# Patient Record
Sex: Male | Born: 1940 | Race: White | Hispanic: No | Marital: Married | Smoking: Former smoker
Health system: Southern US, Community
[De-identification: ages and names within clinical notes are randomized; demographics above are authoritative.]

## PROBLEM LIST (undated history)

## (undated) DIAGNOSIS — E119 Type 2 diabetes mellitus without complications: Secondary | ICD-10-CM

## (undated) DIAGNOSIS — E785 Hyperlipidemia, unspecified: Secondary | ICD-10-CM

## (undated) DIAGNOSIS — L409 Psoriasis, unspecified: Secondary | ICD-10-CM

## (undated) DIAGNOSIS — J449 Chronic obstructive pulmonary disease, unspecified: Secondary | ICD-10-CM

## (undated) DIAGNOSIS — F0390 Unspecified dementia without behavioral disturbance: Secondary | ICD-10-CM

---

## 2007-05-26 ENCOUNTER — Other Ambulatory Visit: Payer: Self-pay

## 2007-05-26 ENCOUNTER — Emergency Department: Payer: Self-pay | Admitting: Internal Medicine

## 2007-06-22 ENCOUNTER — Other Ambulatory Visit: Payer: Self-pay

## 2007-06-22 ENCOUNTER — Emergency Department: Payer: Self-pay | Admitting: Emergency Medicine

## 2007-07-11 ENCOUNTER — Other Ambulatory Visit: Payer: Self-pay

## 2007-07-11 ENCOUNTER — Emergency Department: Payer: Self-pay | Admitting: Emergency Medicine

## 2007-08-06 ENCOUNTER — Ambulatory Visit: Payer: Self-pay | Admitting: Internal Medicine

## 2009-07-17 IMAGING — CR DG CHEST 1V PORT
1 series · 1 of 1 positions shown · non-contrast
Comparison: none

REASON FOR EXAM: sob
COMMENTS:

PROCEDURE:     DXR - DXR PORTABLE CHEST SINGLE VIEW  - May 26, 2007  [DATE]
RESULT:     No acute cardiopulmonary disease is identified.

[view not recorded]
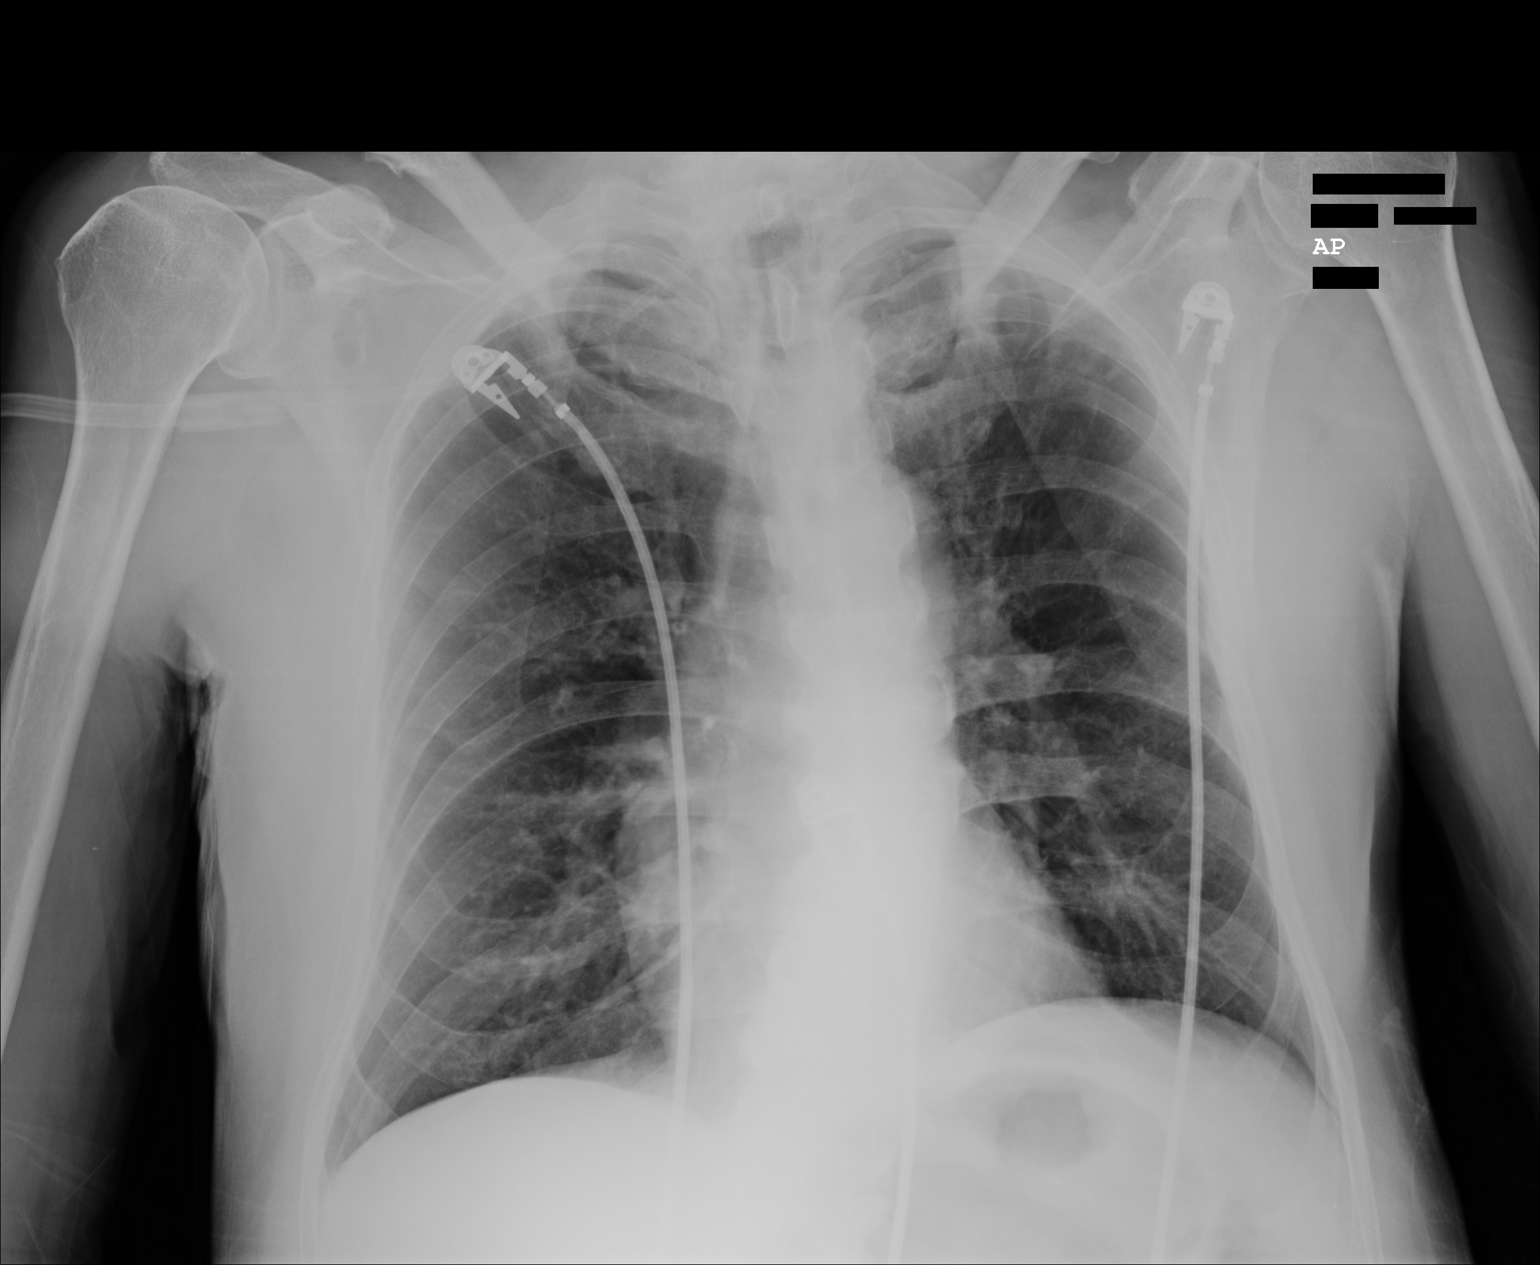

[1 of 1 positions shown; findings below may reference images not displayed]

IMPRESSION: No acute cardiopulmonary disease.

## 2010-09-07 ENCOUNTER — Emergency Department: Payer: Self-pay | Admitting: Emergency Medicine

## 2014-02-18 ENCOUNTER — Emergency Department: Payer: Self-pay | Admitting: Emergency Medicine

## 2014-02-18 LAB — CBC
HCT: 43.2 % (ref 40.0–52.0)
HGB: 14.3 g/dL (ref 13.0–18.0)
MCH: 27.7 pg (ref 26.0–34.0)
MCHC: 33.1 g/dL (ref 32.0–36.0)
MCV: 84 fL (ref 80–100)
Platelet: 200 10*3/uL (ref 150–440)
RBC: 5.15 10*6/uL (ref 4.40–5.90)
RDW: 14.5 % (ref 11.5–14.5)
WBC: 6.1 10*3/uL (ref 3.8–10.6)

## 2014-02-18 LAB — BASIC METABOLIC PANEL
Anion Gap: 3 — ABNORMAL LOW (ref 7–16)
BUN: 13 mg/dL (ref 7–18)
CALCIUM: 8.6 mg/dL (ref 8.5–10.1)
CO2: 30 mmol/L (ref 21–32)
CREATININE: 0.93 mg/dL (ref 0.60–1.30)
Chloride: 107 mmol/L (ref 98–107)
EGFR (African American): >60
GLUCOSE: 104 mg/dL — AB (ref 65–99)
OSMOLALITY: 280 (ref 275–301)
Potassium: 3.7 mmol/L (ref 3.5–5.1)
Sodium: 140 mmol/L (ref 136–145)

## 2014-02-18 LAB — TROPONIN I

## 2015-12-31 ENCOUNTER — Encounter: Payer: Self-pay | Admitting: Emergency Medicine

## 2015-12-31 ENCOUNTER — Emergency Department
Admission: EM | Admit: 2015-12-31 | Discharge: 2015-12-31 | Payer: Medicare (Managed Care) | Attending: Emergency Medicine | Admitting: Emergency Medicine

## 2015-12-31 ENCOUNTER — Emergency Department: Payer: Medicare (Managed Care)

## 2015-12-31 ENCOUNTER — Inpatient Hospital Stay (HOSPITAL_COMMUNITY): Payer: Medicare (Managed Care)

## 2015-12-31 ENCOUNTER — Encounter (HOSPITAL_COMMUNITY)
Admission: AD | Disposition: A | Discharge status: Home / Self Care | Payer: Self-pay | Source: Other Acute Inpatient Hospital | Attending: Pulmonary Disease

## 2015-12-31 ENCOUNTER — Encounter (HOSPITAL_COMMUNITY): Payer: Self-pay | Admitting: *Deleted

## 2015-12-31 ENCOUNTER — Inpatient Hospital Stay (HOSPITAL_COMMUNITY)
Admission: AD | Admit: 2015-12-31 | Discharge: 2016-01-03 | DRG: 393 | Disposition: A | Discharge status: Home / Self Care | Payer: Medicare (Managed Care) | Source: Other Acute Inpatient Hospital | Attending: Internal Medicine | Admitting: Internal Medicine

## 2015-12-31 DIAGNOSIS — F0390 Unspecified dementia without behavioral disturbance: Secondary | ICD-10-CM | POA: Diagnosis present

## 2015-12-31 DIAGNOSIS — Y9289 Other specified places as the place of occurrence of the external cause: Secondary | ICD-10-CM | POA: Diagnosis not present

## 2015-12-31 DIAGNOSIS — Z4659 Encounter for fitting and adjustment of other gastrointestinal appliance and device: Secondary | ICD-10-CM

## 2015-12-31 DIAGNOSIS — G9340 Encephalopathy, unspecified: Secondary | ICD-10-CM

## 2015-12-31 DIAGNOSIS — T18128A Food in esophagus causing other injury, initial encounter: Secondary | ICD-10-CM | POA: Diagnosis present

## 2015-12-31 DIAGNOSIS — R0602 Shortness of breath: Secondary | ICD-10-CM | POA: Diagnosis present

## 2015-12-31 DIAGNOSIS — J690 Pneumonitis due to inhalation of food and vomit: Secondary | ICD-10-CM | POA: Diagnosis present

## 2015-12-31 DIAGNOSIS — E785 Hyperlipidemia, unspecified: Secondary | ICD-10-CM | POA: Diagnosis present

## 2015-12-31 DIAGNOSIS — Y999 Unspecified external cause status: Secondary | ICD-10-CM | POA: Insufficient documentation

## 2015-12-31 DIAGNOSIS — Y9389 Activity, other specified: Secondary | ICD-10-CM | POA: Diagnosis not present

## 2015-12-31 DIAGNOSIS — E876 Hypokalemia: Secondary | ICD-10-CM | POA: Diagnosis present

## 2015-12-31 DIAGNOSIS — K222 Esophageal obstruction: Secondary | ICD-10-CM | POA: Diagnosis present

## 2015-12-31 DIAGNOSIS — Z87891 Personal history of nicotine dependence: Secondary | ICD-10-CM | POA: Insufficient documentation

## 2015-12-31 DIAGNOSIS — T18120A Food in esophagus causing compression of trachea, initial encounter: Secondary | ICD-10-CM | POA: Diagnosis not present

## 2015-12-31 DIAGNOSIS — J449 Chronic obstructive pulmonary disease, unspecified: Secondary | ICD-10-CM | POA: Diagnosis present

## 2015-12-31 DIAGNOSIS — J9601 Acute respiratory failure with hypoxia: Secondary | ICD-10-CM | POA: Diagnosis present

## 2015-12-31 DIAGNOSIS — X58XXXA Exposure to other specified factors, initial encounter: Secondary | ICD-10-CM | POA: Diagnosis not present

## 2015-12-31 DIAGNOSIS — W44F3XA Food entering into or through a natural orifice, initial encounter: Secondary | ICD-10-CM

## 2015-12-31 HISTORY — PX: ESOPHAGOGASTRODUODENOSCOPY: SHX5428

## 2015-12-31 HISTORY — DX: Psoriasis, unspecified: L40.9

## 2015-12-31 HISTORY — DX: Unspecified dementia, unspecified severity, without behavioral disturbance, psychotic disturbance, mood disturbance, and anxiety: F03.90

## 2015-12-31 HISTORY — DX: Hyperlipidemia, unspecified: E78.5

## 2015-12-31 HISTORY — DX: Chronic obstructive pulmonary disease, unspecified: J44.9

## 2015-12-31 LAB — COMPREHENSIVE METABOLIC PANEL
ALK PHOS: 113 U/L (ref 38–126)
ALT: 17 U/L (ref 17–63)
ANION GAP: 8 (ref 5–15)
AST: 21 U/L (ref 15–41)
Albumin: 4.4 g/dL (ref 3.5–5.0)
BILIRUBIN TOTAL: 0.6 mg/dL (ref 0.3–1.2)
BUN: 16 mg/dL (ref 6–20)
CALCIUM: 9.2 mg/dL (ref 8.9–10.3)
CO2: 28 mmol/L (ref 22–32)
Chloride: 104 mmol/L (ref 101–111)
Creatinine, Ser: 0.67 mg/dL (ref 0.61–1.24)
Glucose, Bld: 170 mg/dL — ABNORMAL HIGH (ref 65–99)
Potassium: 3.9 mmol/L (ref 3.5–5.1)
Sodium: 140 mmol/L (ref 135–145)
TOTAL PROTEIN: 7.5 g/dL (ref 6.5–8.1)

## 2015-12-31 LAB — MRSA PCR SCREENING: MRSA BY PCR: NEGATIVE

## 2015-12-31 LAB — CBC WITH DIFFERENTIAL/PLATELET
BASOS PCT: 1 %
Basophils Absolute: 0 10*3/uL (ref 0–0.1)
EOS ABS: 0.3 10*3/uL (ref 0–0.7)
EOS PCT: 4 %
HCT: 42.8 % (ref 40.0–52.0)
HEMOGLOBIN: 14.6 g/dL (ref 13.0–18.0)
LYMPHS ABS: 1.1 10*3/uL (ref 1.0–3.6)
Lymphocytes Relative: 12 %
MCH: 28.5 pg (ref 26.0–34.0)
MCHC: 34.2 g/dL (ref 32.0–36.0)
MCV: 83.3 fL (ref 80.0–100.0)
Monocytes Absolute: 0.5 10*3/uL (ref 0.2–1.0)
Monocytes Relative: 6 %
NEUTROS PCT: 77 %
Neutro Abs: 7.2 10*3/uL — ABNORMAL HIGH (ref 1.4–6.5)
PLATELETS: 211 10*3/uL (ref 150–440)
RBC: 5.14 MIL/uL (ref 4.40–5.90)
RDW: 15.6 % — ABNORMAL HIGH (ref 11.5–14.5)
WBC: 9.3 10*3/uL (ref 3.8–10.6)

## 2015-12-31 LAB — TROPONIN I: Troponin I: <0.03 ng/mL (ref <0.03)

## 2015-12-31 LAB — TRIGLYCERIDES: TRIGLYCERIDES: 59 mg/dL (ref <150)

## 2015-12-31 SURGERY — EGD (ESOPHAGOGASTRODUODENOSCOPY)
Anesthesia: Moderate Sedation

## 2015-12-31 MED ORDER — HEPARIN SODIUM (PORCINE) 5000 UNIT/ML IJ SOLN
5000.0000 [IU] | Freq: Three times a day (TID) | INTRAMUSCULAR | Status: DC
  Administered 2015-12-31 – 2016-01-03 (×9): 5000 [IU] via SUBCUTANEOUS
  Filled 2015-12-31 (×10): qty 1

## 2015-12-31 MED ORDER — PROPOFOL 1000 MG/100ML IV EMUL
5.0000 ug/kg/min | Freq: Once | INTRAVENOUS | Status: AC
Start: 2015-12-31 — End: 2015-12-31
  Administered 2015-12-31: 5 ug/kg/min via INTRAVENOUS

## 2015-12-31 MED ORDER — ANTISEPTIC ORAL RINSE SOLUTION (CORINZ)
7.0000 mL | Freq: Four times a day (QID) | OROMUCOSAL | Status: DC
  Administered 2015-12-31 – 2016-01-01 (×3): 7 mL via OROMUCOSAL

## 2015-12-31 MED ORDER — MIDAZOLAM HCL 10 MG/2ML IJ SOLN
INTRAMUSCULAR | Status: DC | PRN
  Administered 2015-12-31: 2 mg via INTRAVENOUS

## 2015-12-31 MED ORDER — IPRATROPIUM-ALBUTEROL 0.5-2.5 (3) MG/3ML IN SOLN
3.0000 mL | Freq: Four times a day (QID) | RESPIRATORY_TRACT | Status: DC
  Administered 2015-12-31 – 2016-01-02 (×6): 3 mL via RESPIRATORY_TRACT
  Filled 2015-12-31 (×5): qty 3

## 2015-12-31 MED ORDER — FENTANYL CITRATE (PF) 100 MCG/2ML IJ SOLN: 50.0000 ug | INTRAMUSCULAR | Status: DC | PRN

## 2015-12-31 MED ORDER — MIDAZOLAM HCL 2 MG/2ML IJ SOLN
2.0000 mg | Freq: Once | INTRAMUSCULAR | Status: AC
  Administered 2015-12-31: 2 mg via INTRAVENOUS

## 2015-12-31 MED ORDER — CHLORHEXIDINE GLUCONATE 0.12% ORAL RINSE (MEDLINE KIT)
15.0000 mL | Freq: Two times a day (BID) | OROMUCOSAL | Status: DC
  Administered 2015-12-31 – 2016-01-01 (×2): 15 mL via OROMUCOSAL

## 2015-12-31 MED ORDER — PROPOFOL 1000 MG/100ML IV EMUL
INTRAVENOUS | Status: AC
  Administered 2015-12-31: 5 ug/kg/min via INTRAVENOUS
  Filled 2015-12-31: qty 100

## 2015-12-31 MED ORDER — SODIUM CHLORIDE 0.9 % IV SOLN: 250.0000 mL | INTRAVENOUS | Status: DC | PRN

## 2015-12-31 MED ORDER — FENTANYL CITRATE (PF) 100 MCG/2ML IJ SOLN
INTRAMUSCULAR | Status: AC
  Filled 2015-12-31: qty 2

## 2015-12-31 MED ORDER — PROPOFOL 1000 MG/100ML IV EMUL: 5.0000 ug/kg/min | INTRAVENOUS | Status: DC

## 2015-12-31 MED ORDER — PANTOPRAZOLE SODIUM 40 MG PO PACK
40.0000 mg | PACK | Freq: Every day | ORAL | Status: DC
  Administered 2015-12-31: 40 mg
  Filled 2015-12-31 (×2): qty 20

## 2015-12-31 MED ORDER — SODIUM CHLORIDE 0.9 % IV BOLUS (SEPSIS)
1000.0000 mL | Freq: Once | INTRAVENOUS | Status: AC
  Administered 2015-12-31: 1000 mL via INTRAVENOUS

## 2015-12-31 MED ORDER — ASPIRIN 81 MG PO CHEW
81.0000 mg | CHEWABLE_TABLET | Freq: Every day | ORAL | Status: DC
  Administered 2015-12-31 – 2016-01-03 (×4): 81 mg
  Filled 2015-12-31 (×4): qty 1

## 2015-12-31 MED ORDER — ALBUTEROL SULFATE (2.5 MG/3ML) 0.083% IN NEBU: 2.5000 mg | INHALATION_SOLUTION | RESPIRATORY_TRACT | Status: DC | PRN

## 2015-12-31 MED ORDER — SODIUM CHLORIDE 0.9 % IV SOLN
3.0000 g | Freq: Three times a day (TID) | INTRAVENOUS | Status: DC
  Administered 2015-12-31 – 2016-01-03 (×9): 3 g via INTRAVENOUS
  Filled 2015-12-31 (×11): qty 3

## 2015-12-31 MED ORDER — SODIUM CHLORIDE 0.9 % IV SOLN
INTRAVENOUS | Status: DC
  Administered 2015-12-31 – 2016-01-02 (×4): via INTRAVENOUS
  Administered 2016-01-02: 100 mL/h via INTRAVENOUS

## 2015-12-31 MED ORDER — MIDAZOLAM HCL 2 MG/2ML IJ SOLN
1.0000 mg | Freq: Once | INTRAMUSCULAR | Status: AC
  Administered 2015-12-31: 1 mg via INTRAVENOUS

## 2015-12-31 MED ORDER — PROPOFOL 1000 MG/100ML IV EMUL
0.0000 ug/kg/min | INTRAVENOUS | Status: DC
  Administered 2015-12-31 (×2): 50 ug/kg/min via INTRAVENOUS
  Administered 2016-01-01: 35 ug/kg/min via INTRAVENOUS
  Filled 2015-12-31 (×2): qty 100

## 2015-12-31 MED ORDER — IPRATROPIUM-ALBUTEROL 0.5-2.5 (3) MG/3ML IN SOLN
3.0000 mL | Freq: Once | RESPIRATORY_TRACT | Status: AC
  Administered 2015-12-31: 3 mL via RESPIRATORY_TRACT
  Filled 2015-12-31: qty 3

## 2015-12-31 MED ORDER — SUCCINYLCHOLINE CHLORIDE 20 MG/ML IJ SOLN
100.0000 mg | Freq: Once | INTRAMUSCULAR | Status: AC
  Administered 2015-12-31: 100 mg via INTRAVENOUS

## 2015-12-31 MED ORDER — VECURONIUM BROMIDE 10 MG IV SOLR
10.0000 mg | Freq: Once | INTRAVENOUS | Status: AC
  Administered 2015-12-31: 10 mg via INTRAVENOUS
  Filled 2015-12-31: qty 10

## 2015-12-31 MED ORDER — BUDESONIDE 0.5 MG/2ML IN SUSP
0.5000 mg | Freq: Two times a day (BID) | RESPIRATORY_TRACT | Status: DC
  Administered 2015-12-31 – 2016-01-03 (×6): 0.5 mg via RESPIRATORY_TRACT
  Filled 2015-12-31 (×7): qty 2

## 2015-12-31 MED ORDER — MIDAZOLAM HCL 5 MG/ML IJ SOLN
INTRAMUSCULAR | Status: AC
  Filled 2015-12-31: qty 2

## 2015-12-31 MED ORDER — FENTANYL CITRATE (PF) 100 MCG/2ML IJ SOLN
50.0000 ug | INTRAMUSCULAR | Status: DC | PRN
  Administered 2016-01-01: 50 ug via INTRAVENOUS
  Filled 2015-12-31: qty 2

## 2015-12-31 NOTE — ED Notes (Signed)
1mg Versed given

## 2015-12-31 NOTE — ED Notes (Signed)
  of Versed given.

## 2015-12-31 NOTE — H&P (Signed)
PULMONARY / CRITICAL CARE MEDICINE   Name: Aaron [name] MRN: [id number] DOB: Jul 02, 1940    ADMISSION DATE:  12/31/2015 CONSULTATION DATE:  12/31/15  REFERRING MD:  [facility] ED  CHIEF COMPLAINT:  Food Impaction  HISTORY OF PRESENT ILLNESS:  Pt is encephelopathic; therefore, this HPI is obtained from chart review. Aaron [name] is a 75 y.o. male with PMH as outlined below. He lives at [facility] independent living facility and on afternoon of 8/6, he was brought to [facility] due to presumed food impaction.  He was apparently eating lunch which consisted of cube steak and proceeded to have sensation of food stuck in his chest.  He then had increase sputum production and inability to swallow prompting ED referral.  In ED, he progressively got more hypoxemic and after he had an episode of vomiting followed by probable aspiration, hypoxia worsened to the point where he required intubation.  He was transferred to [facility] ICU for further management including consultation by GI for EGD due to food impaction.   PAST MEDICAL HISTORY :  He  has a past medical history of COPD (chronic obstructive pulmonary disease) (HCC); Dementia; Hyperlipidemia; and Psoriasis.  PAST SURGICAL HISTORY: He  has no past surgical history on file.  No Known Allergies  No current facility-administered medications on file prior to encounter.    No current outpatient prescriptions on file prior to encounter.    FAMILY HISTORY:  His has no family status information on file.    SOCIAL HISTORY: He  reports that he has quit smoking. He has never used smokeless tobacco. He reports that he does not drink alcohol or use drugs.  REVIEW OF SYSTEMS:  Unable to perform as pt is encephalopathic.  SUBJECTIVE: On vent, unresponsive.  VITAL SIGNS: BP (!) 132/111 (BP Location: Right Arm)   Pulse (!) 113   Resp (!) 27   SpO2 100%   HEMODYNAMICS:    VENTILATOR SETTINGS: Vent Mode: PRVC FiO2 (%):  [40 %-100 %] 40 % Set Rate:   [15 bmp-20 bmp] 15 bmp Vt Set:  [450 mL-530 mL] 530 mL PEEP:  [5 cmH20] 5 cmH20 Plateau Pressure:  [18 cmH20] 18 cmH20  INTAKE / OUTPUT: No intake/output data recorded.   PHYSICAL EXAMINATION: General: Elderly male, in NAD. Neuro: Sedated, non-responsive. HEENT: [place]/AT. PERRL, sclerae anicteric. Cardiovascular: RRR, no M/R/G.  Lungs: Respirations even and unlabored.  CTA bilaterally, No W/R/R. Abdomen: BS x 4, soft, NT/ND.  Musculoskeletal: No gross deformities, no edema.  Skin: Intact, warm, no rashes.  LABS:  BMET  Recent Labs Lab 12/31/15 1453  NA 140  K 3.9  CL 104  CO2 28  BUN 16  CREATININE 0.67  GLUCOSE 170*    Electrolytes  Recent Labs Lab 12/31/15 1453  CALCIUM 9.2    CBC  Recent Labs Lab 12/31/15 1453  WBC 9.3  HGB 14.6  HCT 42.8  PLT 211    Coag's No results for input(s): APTT, INR in the last 168 hours.  Sepsis Markers No results for input(s): LATICACIDVEN, PROCALCITON, O2SATVEN in the last 168 hours.  ABG No results for input(s): PHART, PCO2ART, PO2ART in the last 168 hours.  Liver Enzymes  Recent Labs Lab 12/31/15 1453  AST 21  ALT 17  ALKPHOS 113  BILITOT 0.6  ALBUMIN 4.4    Cardiac Enzymes  Recent Labs Lab 12/31/15 1453  TROPONINI <0.03    Glucose No results for input(s): GLUCAP in the last 168 hours.  Imaging Dg Neck Soft Tissue  Result Date: 12/31/2015 CLINICAL DATA:  75 year old male with possible ingested foreign body EXAM: NECK SOFT TISSUES - 1+ VIEW COMPARISON:  None. FINDINGS: There is no evidence of retropharyngeal soft tissue swelling or epiglottic enlargement. The cervical airway is unremarkable and no radio-opaque foreign body identified. Multilevel cervical spondylosis with degenerative disc disease and anterior osteophytes. Calcifications present in the carotid arteries bilaterally. The patient is edentulous. Remote healed fracture of the right mid clavicle. Visualized lung apices are unremarkable.  IMPRESSION: 1. No radiopaque foreign body or evidence of airway obstruction. 2. Bilateral carotid artery atherosclerotic calcifications. 3. Multilevel cervical spondylosis. 4. Remote healed right mid clavicular fracture. Electronically Signed   By: Malachy Moan M.D.   On: 12/31/2015 15:23   Ct Soft Tissue Neck Wo Contrast  Result Date: 12/31/2015 CLINICAL DATA:  Feels as though steak is stuck in throat with possible aspiration. EXAM: CT NECK WITHOUT CONTRAST TECHNIQUE: Multidetector CT imaging of the neck was performed following the standard protocol without intravenous contrast. COMPARISON:  None. FINDINGS: Pharynx and larynx: Within normal limits. Salivary glands: Within normal limits. Thyroid: Within normal limits. Lymph nodes: No significant lymphadenopathy is noted. Vascular: Carotid calcifications are seen slightly greater on the left than the right. Limited intracranial: No acute intracranial abnormality is noted. Visualized orbits: Visualized orbits are within normal limits. Mastoids and visualized paranasal sinuses: Some mucosal changes are seen within the paranasal sinuses without definitive air-fluid level. Skeleton: Degenerative changes of the thoracic and cervical spine are noted. No acute bony abnormality is seen. Upper chest: Mild scarring in the apices without acute abnormality within the lungs. There is again identified dilatation of the proximal esophagus with fluid attenuation likely related to a distal food bolus IMPRESSION: Somewhat limited exam due to the lack of IV contrast. Dilatation of the proximal esophagus is again identified as previously described on the chest CT. No acute abnormality within the neck is noted. Specifically no foreign body is noted within the proximal airway Electronically Signed   By: Alcide Clever M.D.   On: 12/31/2015 16:21   Ct Chest Wo Contrast  Result Date: 12/31/2015 CLINICAL DATA:  Feels as though state gaze stuck in throat with difficulty breathing and  swallowing saliva EXAM: CT CHEST WITHOUT CONTRAST TECHNIQUE: Multidetector CT imaging of the chest was performed following the standard protocol without IV contrast. COMPARISON:  08/06/2007 FINDINGS: Cardiovascular: Significantly limited by lack of IV contrast. Aortic calcifications are seen without aneurysmal dilatation. Heavy coronary calcifications are noted. Mediastinum/Nodes: The esophagus is significantly distended with soft tissue density throughout the chest. It is likely that there is a distal food bolus stuck at the level of the gastroesophageal junction with back up of fluids proximally. No significant lymphadenopathy is noted. The thoracic inlet is within normal limits. Lungs/Pleura: The lungs are well aerated bilaterally. Patchy infiltrative changes are seen in the right lower lobe as well as suggestion of a filling defect within the lower lobe bronchial tree consistent with the given clinical history. An 8 mm right lower lobe nodule is seen. No other significant nodular densities are noted. No other focal infiltrate is seen. No effusion is noted. Upper Abdomen: Visualized upper abdomen is within normal limits. Musculoskeletal: Degenerative changes of the thoracic spine are seen. No compression deformity is noted. No acute rib abnormality is seen. IMPRESSION: Dilated esophagus likely related to distal food bolus with back up of fluid within the esophagus. Direct visualization is recommended. Right lower lobe infiltrative change and suggestion of a small filling defect within the  lower lobe bronchial tree best seen on image number 81 of series 4. This would be consistent with aspiration. Right lower lobe nodule which is stable from 2009 and consistent with a benign etiology. Electronically Signed   By: Alcide Clever M.D.   On: 12/31/2015 16:18   Dg Chest Portable 1 View  Result Date: 12/31/2015 CLINICAL DATA:  Patient status post intubation.  Aspiration. EXAM: PORTABLE CHEST 1 VIEW COMPARISON:  Chest  CT earlier same day FINDINGS: Patient is markedly rotated to the right. ET tube terminates in the mid trachea. Stable cardiac and mediastinal contours. Worsening consolidation within the right mid and lower lung. No definite pleural effusion or pneumothorax. IMPRESSION: ET tube terminates in the mid trachea. Worsening right mid and lower lung consolidation. Electronically Signed   By: Annia Belt M.D.   On: 12/31/2015 18:32   Dg Chest Portable 1 View  Result Date: 12/31/2015 CLINICAL DATA:  Patient with aspiration.  Cough. EXAM: PORTABLE CHEST 1 VIEW COMPARISON:  Chest radiograph 02/18/2014 FINDINGS: Patient is markedly rotated, limiting evaluation. Stable cardiac and mediastinal contours. No large area of pulmonary consolidation. No pleural effusion or pneumothorax. Regional skeleton is unremarkable. IMPRESSION: Markedly limited rotated exam. Recommend correlation with PA and lateral chest radiograph when patient clinically able. Within the above limitation, no large area of pulmonary consolidation. Electronically Signed   By: Annia Belt M.D.   On: 12/31/2015 15:26     STUDIES:  CT chest 8/6 > dilated esophagus likely related to food bolus.  RLL infiltrate.  CULTURES: Blood 8/6 > Sputum 8/6 >   ANTIBIOTICS: Unasyn 8/6 >   SIGNIFICANT EVENTS: 8/6 > admitted due to food impaction, required intubation in ED due to hypoxia and aspiration.  LINES/TUBES: ETT 8/6 >  DISCUSSION: 75 y.o. M from independent living facility, admitted 8/6 with food impaction.  Required intubation in ED due to progressive hypoxia which worsened after aspiration.  Transferred to [facility] for GI evaluation including EGD.  ASSESSMENT / PLAN:  PULMONARY A: Acute hypoxic respiratory failure. Aspiration. Hx COPD - no PFT's on file. P:   Full vent support. Wean as able. VAP prevention measures. SBT in AM if able. Abx per ID section. DuoNebs in lieu of outpatient spiriva. Budesonide in lieu of outpatient  advair. Albuterol PRN. CXR in AM.  GASTROINTESTINAL A:   Food impaction - to undergo EGD tonight. GI prophylaxis. Nutrition. P:   F/u on EGD results. SUP: Pantoprazole. NPO.  CARDIOVASCULAR A:  Hx HLD. P:  Continue outpatient ASA. Monitor hemodynamics.  RENAL A:   No acute issues. P:   NS @ 100. BMP in AM.  HEMATOLOGIC A:   VTE Prophylaxis. P:  SCD's / Heparin. CBC in AM.  INFECTIOUS A:   Aspiration. P:   Abx as above (unasyn).  Follow cultures as above.  ENDOCRINE A:   No acute issues.   P:   No interventions required.  NEUROLOGIC A:   Acute encephalopathy. Hx Dementia. P:   Sedation:  Propofol gtt / Fentanyl PRN. RASS goal: 0 to -1. Daily WUA.  Family updated: None available.  Interdisciplinary Family Meeting v Palliative Care Meeting:  Due by: 8/13.  CC time: 35 minutes.   Rutherford Guys, [place] - [facility] [place] [facility] Pager: [phone]  or [phone] 12/31/2015, 8:44 PM  Attending Note:  I have examined patient, reviewed labs, studies and notes. I have discussed the case with Ihor Dow, and I agree with the  data and plans as amended above. 75 yo man with COPD, dementia. Experienced a food impaction with obstruction at the GE junction on 8/6. Was brought to the ED for eval, where he unfortunately had emesis, aspirated and developed acute respiratory failure. He was intubated for hypoxemic resp failure as well as for airway protection. He underwent EGD to retrieve food from the GE junction. CT chest confirmed a RLL infiltrate consistent with aspiration. On my eval he is sedated on propofol 40 and intubated, ventilated. Edentulous w ETT in place. Clear lung exam. Normotensive, NSR with distant heart sounds. We will continue his ventilation overnight, then allow WUA and possibly spontaneous breathing in the am if he can tolerate. Continue Unasyn for aspiration PNA. Budesonide and Duonebs scheduled.  Independent critical  care time is 35 minutes.   Levy Pupaobert David Rodriquez, MD, PhD 01/01/2016, 1:57 AM [place] [facility] [phone] or if no answer [phone]

## 2015-12-31 NOTE — ED Notes (Signed)
2mg versed given.

## 2015-12-31 NOTE — ED Notes (Signed)
Respiratory at bedside at this time. Attempted to suction patient, pt vomited up mucous, onions, and rice. Pt able to tolerate okay at this time. Pt O2 96% on 15L via NRB at this time.

## 2015-12-31 NOTE — ED Notes (Signed)
Pt turned down to 30% FiO2 per MD order at this time. Will continue to monitor.

## 2015-12-31 NOTE — ED Notes (Signed)
Per MD, do not place OG tube due to food bolus.

## 2015-12-31 NOTE — ED Notes (Signed)
MD to bedside, pt's O2 sat 85% on 3L O2 via [place], per MD place patient on 40% FiO2 via Venti mask. Pt placed on venti mask at this time. This RN will continue to monitor at this time.

## 2015-12-31 NOTE — ED Notes (Signed)
MD, RT, Tammy, Medic, Dorinda Hillonald, RN, and [place], RN at bedside to intubate at this time.

## 2015-12-31 NOTE — ED Provider Notes (Signed)
[facility] Emergency Department Provider Note   ____________________________________________   First MD Initiated Contact with Patient 12/31/15 1504     (approximate)  I have reviewed the triage vital signs and the nursing notes.   HISTORY  Chief Complaint Aspiration    HPI Aaron [name] is a 75 y.o. male who was in his usual state of good health at independent living when he was eating cube cubes steak and apparently aspirated. It is somewhat hard to get the history from the patient because his voice is very faint and he is coughing a lot but he said he was feeling fine until he choked on the piece of cube steak. Patient is now becoming progressively hypoxic and increases in spite of oxygen. He is coughing up a lot of clear phlegm and has gurgly respirations.  Past Medical History:  Diagnosis Date  . COPD (chronic obstructive pulmonary disease) (HCC)   . Dementia   . Hyperlipidemia   . Psoriasis     There are no active problems to display for this patient.   History reviewed. No pertinent surgical history.  Prior to Admission medications   Not on File    Allergies Review of patient's allergies indicates no known allergies.  History reviewed. No pertinent family history.  Social History Social History  Substance Use Topics  . Smoking status: Former Games developer  . Smokeless tobacco: Never Used  . Alcohol use No    Review of Systems Constitutional: No fever/chills Eyes: No visual changes. ENT: No sore throat. Cardiovascular: Denies chest pain. Respiratory: shortness of breath. Gastrointestinal: No abdominal pain.  No nausea, no vomiting.  No diarrhea.  No constipation. Genitourinary: Negative for dysuria. Musculoskeletal: Negative for back pain. Skin: Negative for rash. Neurological: Negative for headaches, focal weakness or numbness.  10-point ROS otherwise negative.  ____________________________________________   PHYSICAL  EXAM:  VITAL SIGNS: ED Triage Vitals  Enc Vitals Group     BP 12/31/15 1449 (!) 156/81     Pulse Rate 12/31/15 1449 92     Resp 12/31/15 1449 (!) 28     Temp 12/31/15 1449 97.4 F (36.3 C)     Temp Source 12/31/15 1449 Oral     SpO2 12/31/15 1447 93 %     Weight 12/31/15 1449 140 lb (63.5 kg)     Height 12/31/15 1449 5\' 7"  (1.702 m)     Head Circumference --      Peak Flow --      Pain Score --      Pain Loc --      Pain Edu? --      Excl. in GC? --     Constitutional: Alert and oriented. Her short of breath Eyes: Conjunctivae are normal. PERRL. EOMI. Head: Atraumatic. Nose: No congestion/rhinnorhea. Mouth/Throat: Mucous membranes are moist.  Oropharynx non-erythematous. Neck: No stridor.   Cardiovascular: Normal rate, regular rhythm. Grossly normal heart sounds.  Good peripheral circulation. Respiratory: increased respiratoetractions Gastrointestinal: Soft and nontender. No distention. No abdominal bruits. No CVA tenderness.  gurgly breath sounds throughout Musculoskeletal: No lower extremity tenderness nor edema.  No joint effusions. Neurologic:  Normal speech and language. No gross focal neurologic deficits are appreciated. No gait instability. Skin:  Skin is warm, dry and intact. No rash noted. Psychiatric: Mood and affect are normal. Speech and behavior are normal.  ____________________________________________   LABS (all labs ordered are listed, but only abnormal results are displayed)  Labs Reviewed  COMPREHENSIVE METABOLIC PANEL - Abnormal;  Notable for the following:       Result Value   Glucose, Bld 170 (*)    All other components within normal limits  CBC WITH DIFFERENTIAL/PLATELET - Abnormal; Notable for the following:    RDW 15.6 (*)    Neutro Abs 7.2 (*)    All other components within normal limits  TROPONIN I   ____________________________________________  EKG  EKG read and interpreted by me sm rate of 92 noral axis right bundle branch  block ____________________________________________  RADIOLOGY  Neck film reviewed by me read by radiology Shows no acute disease chest x-ray read by radiology did not seem to show anything I thought that there was little bit more density on the right side than the left CT was done and read by radiology as showing a fluid-filled distal fluid-filled esophagus with a possible distal food bolus impaction and also a right lower lobe after filling defect and some consolidation afterwards mostly likely related to aspiration. I am unable to access the films on the system at present. ____________________________________________   PROCEDURES  Procedure(s) performed: suction being attempted by respiratory therapy Retrieving onions and rice. Respiratory therapy reports she got chunks in chunks of material out of the gentlemen's airway he has now satting 99% on 100% nonrebreather and doing much much better. We will proceed with the CT and I believe at least at present we can avoid rocking him emergently.   Patient returns from CT apparently aspirated again is again hypoxic get suctioned out again and then improves again. I discussed his case with Dr. Adela LankArmbruster GI on-call he said we need to get the hospitalist to accept him decide whether where to put the patient I will now call the hospitalist the intention is to [facility]oscopy to remove the food bolus and also later bronchoscopy. The GI doctor Dr. Adela LankArmbruster suggested intubating the patient to protect his airway after this aspiration when he lay down for CT I agree wholeheartedly have discussed this with his power of attorney who agrees.  Patient sedated with Versed 2 mg at a time until he became sedated than he got 100 mg of succinylcholine. Patient was intubated with a 7.5 tube under direct vision with no difficulty. Had good bilateral breath sounds none the stomach and color change on the colorimetric CO2 detector and  chest x-ray confirmed tube placement is pending. Because of the food bolus obstructing his esophagus I will delay placement of NG tube Dr[facility] except the patient  Procedures  Critical Care performed: Critical care time 45 minutes this includes discussing the patient with the gastroenterologist the intensivist the job the doctors on call for pulmonary reviewing the findings etc. it also includes discussing the findings and procedures with the patient and his  Tobin Chadurney and the rest of the family who are here.  ____________________________________________   INITIAL IMPRESSION / ASSESSMENT AND PLAN / ED COURSE  Pertinent labs & imaging results that were available during my care of the patient were reviewed by me and considered in my medical decision making (see chart for details).    Clinical Course     ____________________________________________   FINAL CLINICAL IMPRESSION(S) / ED DIAGNOSES  Final diagnoses:  Aspiration pneumonia of right lower lobe due to vomit (HCC)  Esophageal obstruction due to food impaction      NEW MEDICATIONS STARTED DURING THIS VISIT:  New Prescriptions   No medications on file     Note:  This document was prepared using Dragon voice recognition software and may include unintentional dictation errors.    Arnaldo Natal, MD 12/31/15 Windy Fast

## 2015-12-31 NOTE — Interval H&P Note (Signed)
History and Physical Interval Note:  12/31/2015 8:37 PM  Aaron [name]  has presented today for surgery, with the diagnosis of Food impaction  The various methods of treatment have been discussed with the patient and family. After consideration of risks, benefits and other options for treatment, the patient has consented to  Procedure(s): ESOPHAGOGASTRODUODENOSCOPY (EGD) (N/A) as a surgical intervention .  The patient's history has been reviewed, patient examined, no change in status, stable for surgery.  I have reviewed the patient's chart and labs.  Questions were answered to the patient's satisfaction.     [name]

## 2015-12-31 NOTE — ED Notes (Signed)
Pt tolerated suctioning well. Pt appears to be resting in bed at this time with eyes closed. Appears to be in less distress that prior to suctioning. Placing pt back on VentiMask on 40% FiO2.

## 2015-12-31 NOTE — Progress Notes (Signed)
Pharmacy Antibiotic Note  Aaron [name] is a 75 y.o. male transferred from [place] for urgent EGD s/p food impaction. CXR shows worsening right mid and lower lung consolidation. He will begin unasyn for possible aspiration pneumonia. Renal function wnl.  Plan: 1) Unasyn 3g IV q8 2) Follow renal function, cultures, LOT     Temp (24hrs), Avg:97.4 F (36.3 C), Min:97.4 F (36.3 C), Max:97.4 F (36.3 C)   Recent Labs Lab 12/31/15 1453  WBC 9.3  CREATININE 0.67    Estimated Creatinine Clearance: 72.8 mL/min (by C-G formula based on SCr of 0.8 mg/dL).    No Known Allergies  Antimicrobials this admission: 8/6 Unasyn >>  Dose adjustments this admission: n/a  Microbiology results: n/a  Thank you for allowing pharmacy to be a part of this patient's care.  Aaron [name], Aaron [name] 12/31/2015 8:50 PM

## 2015-12-31 NOTE — ED Notes (Signed)
[facility] at this time. Unable to obtain consent to transfer due, verbal consent to transfer received by patient's daughter.

## 2015-12-31 NOTE — ED Triage Notes (Signed)
Pt presents to ED via [facility] for [facility] side. Pt states feels like cube steak is stuck in his throat. Cough noted on assessment, pt is coughing up large amounts of mucous and spit. Pt noted to be 85% on RA, pt placed on 2L O2 on arrival, MD notified of patient condition, pt is alert and oriented at this time.

## 2015-12-31 NOTE — ED Provider Notes (Signed)
[facility] Emergency Department Provider Note   ____________________________________________   First MD Initiated Contact with Patient 12/31/15 1504     (approximate)  I have reviewed the triage vital signs and the nursing notes.   HISTORY  Chief Complaint Aspiration   HPI Aaron [name] is a 75 y.o. male who is somewhat hard to understand his voice is now very faint said he was in his usual state of good health when he was eating at [facility] independent living and choked on a piece of Q steak. He is now running oxygen saturations of 86-88% on 2 L of oxygen. He has a very soft voice although he is able to speak and swallow secretions and is coughing up a lot of clear phlegm. His near as I can understand from what he says he was not having any of these problems before or choking on this date.   Past Medical History:  Diagnosis Date  . COPD (chronic obstructive pulmonary disease) (HCC)   . Dementia   . Hyperlipidemia   . Psoriasis     There are no active problems to display for this patient.   History reviewed. No pertinent surgical history.  Prior to Admission medications   Not on File    Allergies Review of patient's allergies indicates no known allergies.  History reviewed. No pertinent family history.  Social History Social History  Substance Use Topics  . Smoking status: Former Games developer  . Smokeless tobacco: Never Used  . Alcohol use No    Review of Systems Constitutional: No fever/chills Eyes: No visual changes. ENT: No sore throat. Cardiovascular: Denies chest pain. Respiratory:  shortness of breath. Gastrointestinal: No abdominal pain.  No nausea, no vomiting.  No diarrhea.  No constipation. Genitourinary: Negative for dysuria. Musculoskeletal: Negative for back pain. Skin: Negative for rash.  10-point ROS otherwise negative.  ____________________________________________   PHYSICAL EXAM:  VITAL SIGNS: ED Triage  Vitals  Enc Vitals Group     BP 12/31/15 1449 (!) 156/81     Pulse Rate 12/31/15 1449 92     Resp 12/31/15 1449 (!) 28     Temp 12/31/15 1449 97.4 F (36.3 C)     Temp Source 12/31/15 1449 Oral     SpO2 12/31/15 1447 93 %     Weight 12/31/15 1449 140 lb (63.5 kg)     Height 12/31/15 1449  (1.702 m)     Head Circumference --      Peak Flow --      Pain Score --      Pain Loc --      Pain Edu? --      Excl. in GC? --     Constitutional: Alert and oriented. Breathing fast having rattly respirations Eyes: Conjunctivae are normal. PERRL. EOMI. Head: Atraumatic. Nose: No congestion/rhinnorhea. Mouth/Throat: Mucous membranes are moist.  Oropharynx non-erythematous. Neck: No stridor.  Cardiovascular: Normal rate, regular rhythm. Grossly normal heart sounds.  Good peripheral circulation. Respiratory: Slightly increased respiratory effort slight retractions diffuse gurgles throughout his lungs Gastrointestinal: Soft and nontender. No distention. No abdominal bruits. No CVA tenderness. Musculoskeletal: No lower extremity tenderness nor edema.  No joint effusions. Neurologic:  Normal speech and language. No gross focal neurologic deficits are appreciated. No gait instability. Skin:  Skin is warm, dry and intact. No rash noted. Psychiatric: Mood and affect are normal. Speech and behavior are normal.  ____________________________________________   LABS (all labs ordered are listed, but only abnormal results are  displayed)  Labs Reviewed  COMPREHENSIVE METABOLIC PANEL - Abnormal; Notable for the following:       Result Value   Glucose, Bld 170 (*)    All other components within normal limits  CBC WITH DIFFERENTIAL/PLATELET - Abnormal; Notable for the following:    RDW 15.6 (*)    Neutro Abs 7.2 (*)    All other components within normal limits  TROPONIN I   ____________________________________________  EKG  EKG read and interpreted by me shows normal sinus rhythm at a rate  of 92 normal axis right bundle branch block ____________________________________________  RADIOLOGY  CT read by radiology as distal esophageal impaction with fluid in the esophagus and also a foreign body in the right lower lung rhonchi with some infiltrate behind it. Chest x-ray and neck films plain films were read by radiology is essentially no acute disease chest x-ray was difficult to interpret per radiology ____________________________________________   PROCEDURES  Procedure(s) performed: This is the third time on dictating a procedure note for this gentleman as intubation. As the other 2 disappeared from the computer system. Patient initially was hypoxic he was suctioned and a large amount of rice and onions was removed from his trachea by respiratory. He then was okay on her percent nonrebreather with sats of 99 looking very comfortable he went to CT returned from CT again hypoxic and had to be resection with more material removed from his trachea with suction. CT came back showing the distal esophageal impaction with fluid in the esophagus. I discussed the patient with Dr. Gaylan [facility] who recommended and I agreed that the patient needed to be intubated for airway protection. The patient and his  Tobin Chadurney were notified of this I discussed the pros and cons and risks with them. They agreed. Patient was intubated with a #7.5 tube under direct vision the tube was visualized going through the cords patient had good bilateral breath sounds and the colorimeter CO2 detector reading was positive for intubation. Chest x-ray was done and interpreted by me showing the tube in good position. Patient was sedated with IV propofol and will be given some vecuronium and a little bit of Versed more to keep him sedated he will be [facility] to [facility].  Procedures  Critical Care performed: Critical care time one and a half hours this includes [facility] and  the patient and the medics came here from [facility] to pick him up.  ____________________________________________   INITIAL IMPRESSION / ASSESSMENT AND PLAN / ED COURSE  Pertinent labs & imaging results that were available during my care of the patient were reviewed by me and considered in my medical decision making (see chart for details).    Clinical Course     ____________________________________________   FINAL CLINICAL IMPRESSION(S) / ED DIAGNOSES  Final diagnoses:  Aspiration pneumonia of right lower lobe due to vomit (HCC)  Esophageal obstruction due to food impaction      NEW MEDICATIONS STARTED DURING THIS VISIT:  New Prescriptions   No medications on file     Note:  This document was prepared using Dragon voice recognition software and may include unintentional dictation errors.    Arnaldo NatalPaul F Malinda, MD 12/31/15 [phone]

## 2015-12-31 NOTE — H&P (Signed)
  History:  This patient presents for endoscopic testing for esophageal food impaction.  History obtained from chart and patient's daughter (April). [facility] where he [facility] today.  Aspirtaed as well, then intubated .  No history can be obtained from patient. Brenton GrillsJohnny P Mcmains Referring physician: No primary care provider on file.  Past Medical History: Past Medical History:  Diagnosis Date  . COPD (chronic obstructive pulmonary disease) (HCC)   . Dementia   . Hyperlipidemia   . Psoriasis    [facility]  Multiple COPD meds and aspirin  Past Surgical History: No past surgical history on file. Daughter reports prior EGD for food impaction Allergies: No Known Allergies  Outpatient Meds: Current Facility-Administered Medications  Medication Dose Route Frequency Provider Last Rate Last Dose  . propofol (DIPRIVAN) 1000 MG/100ML infusion  5-80 mcg/kg/min Intravenous Titrated Serge Main RusselJames Smith, MD          ___________________________________________________________________ Objective   Exam:  BP (!) 132/111 (BP Location: Right Arm)   Pulse (!) 113   Resp (!) 27   SpO2 100%  Chronically ill-appearing elderly man  CV: tachycardic, RRR without murmur, S1/S2, no JVD, no peripheral edema  Resp: good air entry bilaterally, RR 16 on vent  GI: soft with active bowel sounds. No guarding or palpable organomegaly noted.  Neuro: sedated  See CT chest from today.  Esophageal food impaction, also possibly filling defect in bronchial  tree Assessment:  Esophageal food impaction  Plan:  Urgent EGD now.  Family signed consent  The benefits and risks of the planned procedure were described in detail with the patient or (when appropriate) their health care proxy.  Risks were outlined as including, but not limited to, bleeding, infection, perforation, adverse medication reaction leading to cardiac or pulmonary decompensation, or pancreatitis (if ERCP).   The limitation of incomplete mucosal visualization was also discussed.  No guarantees or warranties were given.    [name]

## 2015-12-31 NOTE — Progress Notes (Signed)
Pt. suctionned nasally due to food aspiration.Sx'd mod amt. Of frothy white secretions followed by chunks of food.. Pt. States he feels better after suctionning,placed on venti mask at 40%,sat 95. Pt. Tolerated all procedures well.

## 2015-12-31 NOTE — ED Notes (Signed)
2mg  of versed given by Darl PikesSusan, RN.

## 2015-12-31 NOTE — ED Notes (Signed)
MD to bedside, respiratory called due to patient O2 sat 84% on 30% FiO2, per MD respiratory to bedside to suction patient again. Explained to patients daughter, pt's daughter states okay.

## 2015-12-31 NOTE — Op Note (Signed)
[facility] Patient Name: Aaron [name] Procedure Date : 12/31/2015 MRN: [id number] Attending MD: Starr Lake. Aaron [name] , MD Date of Birth: April 07, 1941 CSN: [id number] Age: 75 Admit Type: Inpatient Procedure:                Upper GI endoscopy Indications:              Foreign body in the esophagus Providers:                Sherilyn Cooter L. Aaron Neither, MD, Tomma Rakers, RN, Oletha Blend, Technician Referring MD:              Medicines:                Monitored Anesthesia Care in ICU with propofol                            (patient intubated and sedated) , versed 2 mg IV Complications:            No immediate complications. Estimated Blood Loss:     Estimated blood loss: none. Procedure:                Pre-Anesthesia Assessment:                           - Prior to the procedure, a History and Physical                            was performed, and patient medications and                            allergies were reviewed. The patient's tolerance of                            previous anesthesia was also reviewed. The risks                            and benefits of the procedure and the sedation                            options and risks were discussed with the patient.                            All questions were answered, and informed consent                            was obtained. Prior Anticoagulants: The patient has                            taken aspirin, last dose was 1 day prior to                            procedure. ASA Grade Assessment: IV - A patient  with severe systemic disease that is a constant                            threat to life. After reviewing the risks and                            benefits, the patient was deemed in satisfactory                            condition to undergo the procedure.                           After obtaining informed consent, the endoscope was                            passed  under direct vision. Throughout the                            procedure, the patient's blood pressure, pulse, and                            oxygen saturations were monitored continuously. The                            was introduced through the mouth, and advanced to                            the duodenal bulb. The upper GI endoscopy was                            accomplished without difficulty. The patient                            tolerated the procedure well. Scope In: Scope Out: Findings:      A large amount of food was found in the entire esophagus. An 8cm long       piece of meat was lodged in the stricture at the EGJ. Removal of food       was accomplished using a few devices.      One mild, benign-appearing, intrinsic stenosis was found after removal       of impacted food. The diameter was approximately 15mm.      The stomach was normal.      The cardia and gastric fundus were normal on retroflexion.      The duodenal bulb was normal. Impression:               - Food in the esophagus. Removal was successful.                           - Benign-appearing esophageal stenosis.                           - Normal stomach.                           -  Normal examined duodenum. Moderate Sedation:      MAC sedation used Recommendation:           - Soft diet indefinitely, after patient extubates.                           Dilation of stricture not likely necessary if                            patient is adherent to a soft diet.                           - Continue present medications. Procedure Code(s):        --- Professional ---                           [phone], Esophagogastroduodenoscopy, flexible,                            transoral; with removal of foreign body(s) Diagnosis Code(s):        --- Professional ---                           U04.540JT18.128A, Food in esophagus causing other injury,                            initial encounter                           K22.2, Esophageal  obstruction                           T18.108A, Unspecified foreign body in esophagus                            causing other injury, initial encounter CPT copyright 2016 American Medical Association. All rights reserved. The codes documented in this report are preliminary and upon coder review may  be revised to meet current compliance requirements. Aaron L. Aaron Neitheranis, MD 12/31/2015 9:15:37 PM This report has been signed electronically. Number of Addenda: 0

## 2015-12-31 NOTE — Progress Notes (Signed)
eLink Physician-Brief Progress Note Patient Name: Brenton GrillsJohnny P Shepardson DOB: 01/12/1941 MRN: [id number]   Date of Service  12/31/2015  HPI/Events of Note  RN asking for OG tube position - to me it looks coiled in stomach  eICU Interventions  Ok to use og/ng tube     Intervention Category Intermediate Interventions: Diagnostic test evaluation  Quashawn Jewkes 12/31/2015, 11:50 PM

## 2015-12-31 NOTE — ED Notes (Signed)
MD and Respiratory to bedside at this time. Pt placed on 15L via NRB due to desat down to 80% on 40% FiO2 on Venti Mask. Airway roll, RIK kit, and glidescope to bedside at this time. MD aware of patient condition.

## 2015-12-31 NOTE — ED Notes (Signed)
Dr. Darnelle CatalanMalinda spoke with pt's daughter regarding intubation and transfer to [facility]. Pt's daughter okay with intubation at this time.

## 2016-01-01 ENCOUNTER — Inpatient Hospital Stay (HOSPITAL_COMMUNITY): Payer: Medicare (Managed Care)

## 2016-01-01 ENCOUNTER — Encounter (HOSPITAL_COMMUNITY): Payer: Self-pay | Admitting: Gastroenterology

## 2016-01-01 DIAGNOSIS — J9601 Acute respiratory failure with hypoxia: Secondary | ICD-10-CM | POA: Diagnosis present

## 2016-01-01 DIAGNOSIS — G9340 Encephalopathy, unspecified: Secondary | ICD-10-CM

## 2016-01-01 LAB — BLOOD GAS, ARTERIAL
Acid-Base Excess: 0 mmol/L (ref 0.0–2.0)
BICARBONATE: 24.1 meq/L — AB (ref 20.0–24.0)
Drawn by: 283401
FIO2: 0.4
LHR: 15 {breaths}/min
MECHVT: 530 mL
O2 Saturation: 98.6 %
PATIENT TEMPERATURE: 98.6
PEEP/CPAP: 5 cmH2O
PH ART: 7.407 (ref 7.350–7.450)
TCO2: 25.3 mmol/L (ref 0–100)
pCO2 arterial: 39.1 mmHg (ref 35.0–45.0)
pO2, Arterial: 132 mmHg — ABNORMAL HIGH (ref 80.0–100.0)

## 2016-01-01 LAB — BASIC METABOLIC PANEL
Anion gap: 7 (ref 5–15)
BUN: 10 mg/dL (ref 6–20)
CALCIUM: 8.2 mg/dL — AB (ref 8.9–10.3)
CO2: 25 mmol/L (ref 22–32)
CREATININE: 0.62 mg/dL (ref 0.61–1.24)
Chloride: 107 mmol/L (ref 101–111)
GFR calc non Af Amer: >60 mL/min (ref >60)
Glucose, Bld: 169 mg/dL — ABNORMAL HIGH (ref 65–99)
Potassium: 3.3 mmol/L — ABNORMAL LOW (ref 3.5–5.1)
SODIUM: 139 mmol/L (ref 135–145)

## 2016-01-01 LAB — GLUCOSE, CAPILLARY: GLUCOSE-CAPILLARY: 146 mg/dL — AB (ref 65–99)

## 2016-01-01 LAB — PHOSPHORUS: PHOSPHORUS: 3.4 mg/dL (ref 2.5–4.6)

## 2016-01-01 LAB — CBC
HCT: 37.6 % — ABNORMAL LOW (ref 39.0–52.0)
Hemoglobin: 12.1 g/dL — ABNORMAL LOW (ref 13.0–17.0)
MCH: 27.4 pg (ref 26.0–34.0)
MCHC: 32.2 g/dL (ref 30.0–36.0)
MCV: 85.1 fL (ref 78.0–100.0)
Platelets: 187 10*3/uL (ref 150–400)
RBC: 4.42 MIL/uL (ref 4.22–5.81)
RDW: 14.9 % (ref 11.5–15.5)
WBC: 11.6 10*3/uL — ABNORMAL HIGH (ref 4.0–10.5)

## 2016-01-01 LAB — MAGNESIUM: MAGNESIUM: 1.6 mg/dL — AB (ref 1.7–2.4)

## 2016-01-01 MED ORDER — MAGNESIUM SULFATE 2 GM/50ML IV SOLN
2.0000 g | Freq: Once | INTRAVENOUS | Status: AC
  Administered 2016-01-01: 2 g via INTRAVENOUS
  Filled 2016-01-01: qty 50

## 2016-01-01 MED ORDER — FENTANYL CITRATE (PF) 100 MCG/2ML IJ SOLN
INTRAMUSCULAR | Status: AC
  Filled 2016-01-01: qty 2

## 2016-01-01 MED ORDER — POTASSIUM CHLORIDE 20 MEQ/15ML (10%) PO SOLN
20.0000 meq | ORAL | Status: AC
  Administered 2016-01-01 (×2): 20 meq
  Filled 2016-01-01 (×2): qty 15

## 2016-01-01 MED ORDER — CHLORHEXIDINE GLUCONATE 0.12 % MT SOLN
15.0000 mL | Freq: Two times a day (BID) | OROMUCOSAL | Status: DC
  Administered 2016-01-01 – 2016-01-03 (×3): 15 mL via OROMUCOSAL
  Filled 2016-01-01 (×2): qty 15

## 2016-01-01 MED ORDER — CETYLPYRIDINIUM CHLORIDE 0.05 % MT LIQD
7.0000 mL | Freq: Two times a day (BID) | OROMUCOSAL | Status: DC
  Administered 2016-01-02: 7 mL via OROMUCOSAL

## 2016-01-01 NOTE — Progress Notes (Signed)
Video bronchoscopy performed.  Intervention bronchial washing.  Patient tolerated well.

## 2016-01-01 NOTE — Progress Notes (Signed)
[facility] AD[facility] REPLACEMENT PROTOCOL FOR AM LAB REPLACEMENT ONLY  The patient does apply for the [facility] Adult [facility] Electrolyte Replacment Protocol based on the criteria listed below:   1. Is GFR >/= 40 ml/min? Yes.    Patient's GFR today is >60 2. Is urine output >/= 0.5 ml/kg/hr for the last 6 hours? Yes.   Patient's UOP is 1.0 ml/kg/hr 3. Is BUN < 60 mg/dL? Yes.    Patient's BUN today is 10 4. Abnormal electrolyte(s): K+3.3 Mg 1.6 5. Ordered repletion with: Protocol 6. If a panic level lab has been reported, has the CCM MD in charge been notified? No..   Physician:  Aaron [name]  [name] 01/01/2016 3:34 AM

## 2016-01-01 NOTE — Progress Notes (Signed)
Patient self extubated. RT finished pulling tube out of patients mouth and placed him on a 4L [place]. Patient is currently stable and tolerating the [place] well. RT will continue to monitor.

## 2016-01-01 NOTE — Progress Notes (Addendum)
Patient is from [facility] [place]. Patient currently intubated. CSW following for disposition.        Lance MussAshley Gardner,MSW, LCSW [facility] ED/[facility]M Clinical Social Worker [phone]

## 2016-01-01 NOTE — Care Management Note (Addendum)
Case Management Note  Patient Details  Name: Aaron [name] MRN: [id number] Date of Birth: 11-02-1940  Subjective/Objective:   Pt admitted post aspiration event from food impaction                 Action/Plan:  [facility]  CM will continue to follow for discharge needs - will consult with CSW of possible referral depending on progression of discharge plan - possibly SNF.  CM spoke directly with [facility] Case Manager Eunice BlaseDebbie [phone] - Pt active with [facility].  CM will continue to follow for discharge needs   Expected Discharge Date:                  Expected Discharge Plan:  Skilled Nursing Facility  In-House Referral:  Clinical Social Work  Discharge planning Services  CM Consult  Post Acute Care Choice:    Choice offered to:     DME Arranged:    DME Agency:     HH Arranged:    HH Agency:     Status of Service:  In process, will continue to follow  If discussed at Long Length of Stay Meetings, dates discussed:    Additional Comments:  Cherylann ParrClaxton, [place] Noori S, RN 01/01/2016, 8:22 AM

## 2016-01-01 NOTE — Progress Notes (Signed)
PULMONARY / CRITICAL CARE MEDICINE   Name: Aaron [name] MRN: [id number] DOB: 03-30-1941    ADMISSION DATE:  12/31/2015 CONSULTATION DATE:  12/31/15  REFERRING MD:  [facility] ED  CHIEF COMPLAINT:  Food Impaction  HISTORY OF PRESENT ILLNESS:   Aaron [name] is a 75 y.o. male with PMH as outlined below. He lives at [facility] independent living facility and on afternoon of 8/6, he was brought to [facility] due to presumed food impaction.  He was apparently eating lunch which consisted of cube steak and proceeded to have sensation of food stuck in his chest.  He then had increase sputum production and inability to swallow prompting ED referral.  In ED, he progressively got more hypoxemic and after he had an episode of vomiting followed by probable aspiration, hypoxia worsened to the point where he required intubation.  He was transferred to [facility] ICU for further management including consultation by GI for EGD due to food impaction.    SUBJECTIVE: On vent, afebrile Sedated on propofol gtt  VITAL SIGNS: BP (!) 96/50   Pulse 83   Temp 98.7 F (37.1 C) (Oral)   Resp 15   Wt 132 lb 0.9 oz (59.9 kg)   SpO2 100%   BMI 20.68 kg/m   HEMODYNAMICS:    VENTILATOR SETTINGS: Vent Mode: PRVC FiO2 (%):  [40 %-100 %] 100 % Set Rate:  [15 bmp-20 bmp] 15 bmp Vt Set:  [450 mL-530 mL] 530 mL PEEP:  [5 cmH20] 5 cmH20 Pressure Support:  [5 cmH20] 5 cmH20 Plateau Pressure:  [15 cmH20-18 cmH20] 15 cmH20  INTAKE / OUTPUT: I/O last 3 completed shifts: In: 1326.5 [I.V.:1076.5; IV Piggyback:250] Out: 510 [Urine:510]   PHYSICAL EXAMINATION: General: Elderly male, in NAD. Neuro: Sedated, non-responsive. HEENT: [place]/AT. PERRL, sclerae anicteric. Cardiovascular: RRR, no M/R/G.  Lungs: Respirations even and unlabored.  CTA bilaterally, No W/R/R. Abdomen: BS x 4, soft, NT/ND.  Musculoskeletal: No gross deformities, no edema.  Skin: Intact, warm, no rashes.  LABS:  BMET  Recent Labs Lab 12/31/15 1453  01/01/16 0225  NA 140 139  K 3.9 3.3*  CL 104 107  CO2 28 25  BUN 16 10  CREATININE 0.67 0.62  GLUCOSE 170* 169*    Electrolytes  Recent Labs Lab 12/31/15 1453 01/01/16 0225  CALCIUM 9.2 8.2*  MG  --  1.6*  PHOS  --  3.4    CBC  Recent Labs Lab 12/31/15 1453 01/01/16 0225  WBC 9.3 11.6*  HGB 14.6 12.1*  HCT 42.8 37.6*  PLT 211 187    Coag's No results for input(s): APTT, INR in the last 168 hours.  Sepsis Markers No results for input(s): LATICACIDVEN, PROCALCITON, O2SATVEN in the last 168 hours.  ABG  Recent Labs Lab 01/01/16 0330  PHART 7.407  PCO2ART 39.1  PO2ART 132*    Liver Enzymes  Recent Labs Lab 12/31/15 1453  AST 21  ALT 17  ALKPHOS 113  BILITOT 0.6  ALBUMIN 4.4    Cardiac Enzymes  Recent Labs Lab 12/31/15 1453  TROPONINI <0.03    Glucose  Recent Labs Lab 12/31/15 1926  GLUCAP 146*    Imaging Dg Neck Soft Tissue  Result Date: 12/31/2015 CLINICAL DATA:  75 year old male with possible ingested foreign body EXAM: NECK SOFT TISSUES - 1+ VIEW COMPARISON:  None. FINDINGS: There is no evidence of retropharyngeal soft tissue swelling or epiglottic enlargement. The cervical airway is unremarkable and no radio-opaque foreign body identified. Multilevel cervical spondylosis with degenerative disc disease  and anterior osteophytes. Calcifications present in the carotid arteries bilaterally. The patient is edentulous. Remote healed fracture of the right mid clavicle. Visualized lung apices are unremarkable. IMPRESSION: 1. No radiopaque foreign body or evidence of airway obstruction. 2. Bilateral carotid artery atherosclerotic calcifications. 3. Multilevel cervical spondylosis. 4. Remote healed right mid clavicular fracture. Electronically Signed   By: Malachy Moan M.D.   On: 12/31/2015 15:23   Ct Soft Tissue Neck Wo Contrast  Result Date: 12/31/2015 CLINICAL DATA:  Feels as though steak is stuck in throat with possible aspiration.  EXAM: CT NECK WITHOUT CONTRAST TECHNIQUE: Multidetector CT imaging of the neck was performed following the standard protocol without intravenous contrast. COMPARISON:  None. FINDINGS: Pharynx and larynx: Within normal limits. Salivary glands: Within normal limits. Thyroid: Within normal limits. Lymph nodes: No significant lymphadenopathy is noted. Vascular: Carotid calcifications are seen slightly greater on the left than the right. Limited intracranial: No acute intracranial abnormality is noted. Visualized orbits: Visualized orbits are within normal limits. Mastoids and visualized paranasal sinuses: Some mucosal changes are seen within the paranasal sinuses without definitive air-fluid level. Skeleton: Degenerative changes of the thoracic and cervical spine are noted. No acute bony abnormality is seen. Upper chest: Mild scarring in the apices without acute abnormality within the lungs. There is again identified dilatation of the proximal esophagus with fluid attenuation likely related to a distal food bolus IMPRESSION: Somewhat limited exam due to the lack of IV contrast. Dilatation of the proximal esophagus is again identified as previously described on the chest CT. No acute abnormality within the neck is noted. Specifically no foreign body is noted within the proximal airway Electronically Signed   By: Alcide Clever M.D.   On: 12/31/2015 16:21   Ct Chest Wo Contrast  Result Date: 12/31/2015 CLINICAL DATA:  Feels as though state gaze stuck in throat with difficulty breathing and swallowing saliva EXAM: CT CHEST WITHOUT CONTRAST TECHNIQUE: Multidetector CT imaging of the chest was performed following the standard protocol without IV contrast. COMPARISON:  08/06/2007 FINDINGS: Cardiovascular: Significantly limited by lack of IV contrast. Aortic calcifications are seen without aneurysmal dilatation. Heavy coronary calcifications are noted. Mediastinum/Nodes: The esophagus is significantly distended with soft  tissue density throughout the chest. It is likely that there is a distal food bolus stuck at the level of the gastroesophageal junction with back up of fluids proximally. No significant lymphadenopathy is noted. The thoracic inlet is within normal limits. Lungs/Pleura: The lungs are well aerated bilaterally. Patchy infiltrative changes are seen in the right lower lobe as well as suggestion of a filling defect within the lower lobe bronchial tree consistent with the given clinical history. An 8 mm right lower lobe nodule is seen. No other significant nodular densities are noted. No other focal infiltrate is seen. No effusion is noted. Upper Abdomen: Visualized upper abdomen is within normal limits. Musculoskeletal: Degenerative changes of the thoracic spine are seen. No compression deformity is noted. No acute rib abnormality is seen. IMPRESSION: Dilated esophagus likely related to distal food bolus with back up of fluid within the esophagus. Direct visualization is recommended. Right lower lobe infiltrative change and suggestion of a small filling defect within the lower lobe bronchial tree best seen on image number 81 of series 4. This would be consistent with aspiration. Right lower lobe nodule which is stable from 2009 and consistent with a benign etiology. Electronically Signed   By: Alcide Clever M.D.   On: 12/31/2015 16:18   Dg Chest Port 1  View  Result Date: 01/01/2016 CLINICAL DATA:  Respiratory failure. EXAM: PORTABLE CHEST 1 VIEW COMPARISON:  01/09/2017. FINDINGS: Interim placement of NG tube, its tip is below left hemidiaphragm. Endotracheal tube in stable position. Persistent right lower lobe atelectasis and consolidation. No pleural effusion or pneumothorax. Heart size stable. IMPRESSION: 1. Interim placement of NG tube, its tip is below left hemidiaphragm. Endotracheal tube in stable position. 2. Persistent right lower lobe consolidation and atelectasis. Electronically Signed   By: Maisie Fushomas  Register    On: 01/01/2016 06:53   Dg Chest Portable 1 View  Result Date: 12/31/2015 CLINICAL DATA:  Patient status post intubation.  Aspiration. EXAM: PORTABLE CHEST 1 VIEW COMPARISON:  Chest CT earlier same day FINDINGS: Patient is markedly rotated to the right. ET tube terminates in the mid trachea. Stable cardiac and mediastinal contours. Worsening consolidation within the right mid and lower lung. No definite pleural effusion or pneumothorax. IMPRESSION: ET tube terminates in the mid trachea. Worsening right mid and lower lung consolidation. Electronically Signed   By: Annia Beltrew  Davis M.D.   On: 12/31/2015 18:32   Dg Chest Portable 1 View  Result Date: 12/31/2015 CLINICAL DATA:  Patient with aspiration.  Cough. EXAM: PORTABLE CHEST 1 VIEW COMPARISON:  Chest radiograph 02/18/2014 FINDINGS: Patient is markedly rotated, limiting evaluation. Stable cardiac and mediastinal contours. No large area of pulmonary consolidation. No pleural effusion or pneumothorax. Regional skeleton is unremarkable. IMPRESSION: Markedly limited rotated exam. Recommend correlation with PA and lateral chest radiograph when patient clinically able. Within the above limitation, no large area of pulmonary consolidation. Electronically Signed   By: Annia Beltrew  Davis M.D.   On: 12/31/2015 15:26   Dg Abd Portable 1v  Result Date: 01/01/2016 CLINICAL DATA:  Feeding tube placement.  Initial encounter. EXAM: PORTABLE ABDOMEN - 1 VIEW COMPARISON:  None. FINDINGS: The patient's enteric tube is noted ending overlying the body of the stomach. The visualized bowel gas pattern is grossly unremarkable. A small amount of stool is seen in the colon. No free intra-abdominal air is seen, though evaluation free air is limited on a single supine view. No acute osseous abnormalities are identified. The visualized lung bases are grossly clear. IMPRESSION: Enteric tube noted ending overlying the body of the stomach. Electronically Signed   By: Roanna RaiderJeffery  Chang M.D.   On:  01/01/2016 01:14     STUDIES:  CT chest 8/6 > dilated esophagus likely related to food bolus.  RLL infiltrate.  CULTURES: Blood 8/6 > Sputum 8/6 >   ANTIBIOTICS: Unasyn 8/6 >   SIGNIFICANT EVENTS: 8/6 > admitted due to food impaction, required intubation in ED due to hypoxia and aspiration.  LINES/TUBES: ETT 8/6 >  DISCUSSION: 75 y.o. M from independent living facility, admitted 8/6 with food impaction.  Required intubation in ED due to progressive hypoxia which worsened after aspiration.  [facility] including EGD.  ASSESSMENT / PLAN:  PULMONARY A: Acute hypoxic respiratory failure. Aspiration. Hx COPD - no PFT's on file. P:   Bronchoscopy today to suction oiut food debris from RLL SBT with goal extubation DuoNebs in lieu of outpatient spiriva. Budesonide in lieu of outpatient advair. Albuterol PRN.   GASTROINTESTINAL A:   Food impaction - s/p EGD & removal of steak GI prophylaxis. Nutrition. P:   SUP: Pantoprazole. NPO. Swallow eval eventual  CARDIOVASCULAR A:  Hx HLD. P:  Continue outpatient ASA.   RENAL A:   hypokalemia P:   NS @ 75 Replete lytes BMP in AM.  HEMATOLOGIC A:   VTE Prophylaxis. P:  SCD's / Heparin. CBC in AM.  INFECTIOUS A:   Aspiration. P:   Abx as above (unasyn).  Follow cultures as above.  ENDOCRINE A:   No acute issues.   P:   No interventions required.  NEUROLOGIC A:   Acute encephalopathy. Hx Dementia. P:   Sedation:  Propofol gtt / Fentanyl PRN. RASS goal: 0 to -1. Daily WUA. Follows up with [facility] program - d/w Dr Victory Dakin  Family updated: daughter at bedside  Interdisciplinary Family Meeting v Palliative Care Meeting:  Due by: 8/13.  CC time: 35 minutes.   Cyril Mourning MD. Tonny Bollman. [facility] Pager [phone] If no response call [phone]   01/01/2016   01/01/2016, 1:47 PM

## 2016-01-01 NOTE — Procedures (Signed)
Bronchoscopy Procedure Note Brenton GrillsJohnny P Kanitz [id number] 11-11-1940  Procedure: Bronchoscopy Indications: Diagnostic evaluation of the airways and Remove secretions  Procedure Details Consent: Risks of procedure as well as the alternatives and risks of each were explained to the (patient/caregiver).  Consent for procedure obtained. Time Out: Verified patient identification, verified procedure, site/side was marked, verified correct patient position, special equipment/implants available, medications/allergies/relevent history reviewed, required imaging and test results available.  Performed  In preparation for procedure, patient was given 100% FiO2 and bronchoscope lubricated. Sedation: propofol gtt + fent 50 mcg  Airway entered and the following bronchi were examined: Bronchi.  No food material , Minimal purulent secretions in RLL suctioned out Procedures performed: BAL performed Bronchoscope removed.  , Patient placed back on 100% FiO2 at conclusion of procedure.    Evaluation Hemodynamic Status: BP stable throughout; O2 sats: stable throughout Patient's Current Condition: stable Specimens:  Sent purulent fluid Complications: No apparent complications Patient did tolerate procedure well.   ALVA,RAKESH V. 01/01/2016

## 2016-01-01 NOTE — Procedures (Signed)
Extubation Procedure Note  Patient Details:   Name: Brenton GrillsJohnny P Pais DOB: 02/04/1941 MRN: [id number]   Airway Documentation:  Airway 7.5 mm (Active)  Secured at (cm) 21 cm 01/01/2016 11:21 AM  Measured From Lips 01/01/2016 11:21 AM  Secured Location Center 01/01/2016 11:21 AM  Secured By [employer] Tube Holder 01/01/2016 11:21 AM  Tube Holder Repositioned Yes 01/01/2016 11:21 AM  Cuff Pressure (cm H2O) 28 cm H2O 01/01/2016 12:58 AM  Site Condition Dry 01/01/2016  7:37 AM    Evaluation  O2 sats: stable throughout Complications: No apparent complications Patient did tolerate procedure well. Bilateral Breath Sounds: Diminished   Yes   Patient self extubated. RT removed tube the rest of the way out of patients mouth and placed pt on a 4L [place]. Patient was able to vocalize afterwards and is maintaining his O2 levels at this time. RT will continue to monitor.  Darolyn Ruashley M Arbor Leer 01/01/2016, 2:34 PM

## 2016-01-02 ENCOUNTER — Inpatient Hospital Stay (HOSPITAL_COMMUNITY): Payer: Medicare (Managed Care)

## 2016-01-02 LAB — BASIC METABOLIC PANEL
Anion gap: 6 (ref 5–15)
BUN: 6 mg/dL (ref 6–20)
CALCIUM: 7.9 mg/dL — AB (ref 8.9–10.3)
CO2: 25 mmol/L (ref 22–32)
CREATININE: 0.65 mg/dL (ref 0.61–1.24)
Chloride: 111 mmol/L (ref 101–111)
GFR calc Af Amer: >60 mL/min (ref >60)
GLUCOSE: 105 mg/dL — AB (ref 65–99)
Potassium: 3.3 mmol/L — ABNORMAL LOW (ref 3.5–5.1)
SODIUM: 142 mmol/L (ref 135–145)

## 2016-01-02 LAB — BLOOD CULTURE ID PANEL (REFLEXED)

## 2016-01-02 LAB — CBC
HCT: 33.1 % — ABNORMAL LOW (ref 39.0–52.0)
Hemoglobin: 10.5 g/dL — ABNORMAL LOW (ref 13.0–17.0)
MCH: 27.2 pg (ref 26.0–34.0)
MCHC: 31.7 g/dL (ref 30.0–36.0)
MCV: 85.8 fL (ref 78.0–100.0)
PLATELETS: 159 10*3/uL (ref 150–400)
RBC: 3.86 MIL/uL — ABNORMAL LOW (ref 4.22–5.81)
RDW: 15.2 % (ref 11.5–15.5)
WBC: 8.5 10*3/uL (ref 4.0–10.5)

## 2016-01-02 MED ORDER — ATORVASTATIN CALCIUM 20 MG PO TABS: 20.0000 mg | ORAL_TABLET | Freq: Every day | ORAL | Status: DC

## 2016-01-02 MED ORDER — CETYLPYRIDINIUM CHLORIDE 0.05 % MT LIQD: 7.0000 mL | Freq: Two times a day (BID) | OROMUCOSAL | Status: DC

## 2016-01-02 MED ORDER — POTASSIUM CHLORIDE 10 MEQ/100ML IV SOLN
10.0000 meq | INTRAVENOUS | Status: AC
Start: 2016-01-02 — End: 2016-01-02
  Administered 2016-01-02 (×4): 10 meq via INTRAVENOUS
  Filled 2016-01-02 (×4): qty 100

## 2016-01-02 MED ORDER — LATANOPROST 0.005 % OP SOLN
1.0000 [drp] | Freq: Every day | OPHTHALMIC | Status: DC
  Filled 2016-01-02: qty 2.5

## 2016-01-02 MED ORDER — PANTOPRAZOLE SODIUM 40 MG PO TBEC: 40.0000 mg | DELAYED_RELEASE_TABLET | Freq: Every day | ORAL | Status: DC

## 2016-01-02 MED ORDER — IPRATROPIUM-ALBUTEROL 0.5-2.5 (3) MG/3ML IN SOLN
3.0000 mL | Freq: Four times a day (QID) | RESPIRATORY_TRACT | Status: DC
  Administered 2016-01-02 – 2016-01-03 (×7): 3 mL via RESPIRATORY_TRACT
  Filled 2016-01-02 (×7): qty 3

## 2016-01-02 NOTE — Evaluation (Signed)
Clinical/Bedside Swallow Evaluation Patient Details  Name: Aaron GrillsJohnny P [name] MRN: [id number] Date of Birth: 12-Aug-1940  Today's Date: 01/02/2016 Time: SLP Start Time (ACUTE ONLY): 1324 SLP Stop Time (ACUTE ONLY): 1333 SLP Time Calculation (min) (ACUTE ONLY): 9 min  Past Medical History:  Past Medical History:  Diagnosis Date  . COPD (chronic obstructive pulmonary disease) (HCC)   . Dementia   . Hyperlipidemia   . Psoriasis    Past Surgical History:  Past Surgical History:  Procedure Laterality Date  . ESOPHAGOGASTRODUODENOSCOPY N/A 12/31/2015   Procedure: ESOPHAGOGASTRODUODENOSCOPY (EGD);  Surgeon: Sherrilyn RistHenry L Danis III, MD;  [facility];  Service: Gastroenterology;  Laterality: N/A;   HPI:  75 y.o.M from independent living facility, admitted 8/6 with food impaction requiring EGD for removal. He required intubation 8/6 due to progressive hypoxia which worsened after aspiration. he self-extubated 8/7. PMH of psoriasis, HLD, dementia and COPD   Assessment / Plan / Recommendation Clinical Impression  Pt has no overt s/s of aspiration with all consistencies trialed, even when challenged with large, consecutive straw sips of thin liquid. He does however have poor awareness for bolus preparation with soft solids, playing with boluses in his mouth but not initiating mastication. Ultimately, he expectorated the food and SLP cleared it from his lips. Recommend Dys 1 diet and thin liquids. Pt will need full supervision to assist with self-feeding and to increase safety.    Aspiration Risk  Mild aspiration risk;Moderate aspiration risk    Diet Recommendation Dysphagia 1 (Puree);Thin liquid   Liquid Administration via: Cup;Straw Medication Administration: Crushed with puree Supervision: Staff to assist with self feeding;Full supervision/cueing for compensatory strategies Compensations: Minimize environmental distractions;Slow rate;Small sips/bites;Follow solids with liquid Postural  Changes: Seated upright at 90 degrees;Remain upright for at least 30 minutes after po intake    Other  Recommendations Oral Care Recommendations: Oral care BID Other Recommendations: Have oral suction available   Follow up Recommendations  Skilled Nursing facility    Frequency and Duration min 2x/week  2 weeks       Prognosis Prognosis for Safe Diet Advancement: Fair Barriers to Reach Goals: Cognitive deficits      Swallow Study   General HPI: 75 y.o.M from independent living facility, admitted 8/6 with food impaction requiring EGD for removal. He required intubation 8/6 due to progressive hypoxia which worsened after aspiration. he self-extubated 8/7. PMH of psoriasis, HLD, dementia and COPD Type of Study: Bedside Swallow Evaluation Previous Swallow Assessment: none in chart Diet Prior to this Study: NPO Temperature Spikes Noted: No Respiratory Status: Room air History of Recent Intubation: Yes Length of Intubations (days): 1 days Date extubated: 01/01/16 Behavior/Cognition: Alert;Cooperative;Confused Oral Cavity Assessment: Within Functional Limits Oral Care Completed by SLP: No Oral Cavity - Dentition: Edentulous Vision: Functional for self-feeding Self-Feeding Abilities: Needs assist Patient Positioning: Upright in bed Baseline Vocal Quality: Normal Volitional Cough: Cognitively unable to elicit    Oral/Motor/Sensory Function     Ice Chips Ice chips: Not tested   Thin Liquid Thin Liquid: Impaired Presentation: Cup;Self Fed;Straw Pharyngeal  Phase Impairments: Suspected delayed Swallow    Nectar Thick Nectar Thick Liquid: Not tested   Honey Thick Honey Thick Liquid: Not tested   Puree Puree: Within functional limits Presentation: Spoon   Solid   GO   Solid: Impaired Oral Phase Impairments: Poor awareness of bolus;Impaired mastication        Maxcine Hamaiewonsky, Lailany Enoch 01/02/2016,1:46 PM  Maxcine HamLaura Paiewonsky, M.A. CCC-SLP [phone]

## 2016-01-02 NOTE — Progress Notes (Signed)
Pt received from ICU RN. Pt oriented to self. Pt family at bedside. Telemetry applied, CCMD notified. Call light within reach, Posey activity belt on pt lap.   Leonidas Rombergaitlin S Bumbledare, RN

## 2016-01-02 NOTE — Progress Notes (Signed)
PULMONARY / CRITICAL CARE MEDICINE   Name: Aaron [name] Mccullum MRN: [id number] DOB: [birth year]    ADMISSION DATE:  12/31/2015 CONSULTATION DATE:  12/31/15  [facility] ED  CHIEF COMPLAINT:  Food Impaction  HISTORY OF PRESENT ILLNESS:   Aaron [name] is a 75 y.o. male with PMH of psoriasis, HLD, dementia and COPD.  He lives at [facility] independent living facility and on afternoon of 8/6, he was [facility] due to presumed food impaction.  He was apparently eating lunch which consisted of cube steak and proceeded to have sensation of food stuck in his chest.  He then had increase sputum production and inability to swallow prompting ED referral.  In ED, he progressively got more hypoxemic and after he had an episode of vomiting followed by probable aspiration, hypoxia worsened to the point where he required intubation.  [facility] further management including consultation by GI for EGD due to food impaction.    SUBJECTIVE:   Pt denies acute complaints.  Sats 92-93% on RA (nasal cannula in nose but O2 off).  Self extubated 8/7  VITAL SIGNS: BP 97/60 (BP Location: Right Arm)   Pulse 71   Temp 97.5 F (36.4 C) (Axillary)   Resp 16   Wt 130 lb 8.2 oz (59.2 kg)   SpO2 94%   BMI 20.44 kg/m   HEMODYNAMICS:    VENTILATOR SETTINGS: Vent Mode: PRVC FiO2 (%):  [28 %-100 %] 28 % Set Rate:  [15 bmp] 15 bmp Vt Set:  [530 mL] 530 mL PEEP:  [5 cmH20] 5 cmH20  INTAKE / OUTPUT: I/O last 3 completed shifts: In: 4397 [I.V.:3517; Other:300; NG/GT:30; IV Piggyback:550] Out: 2310 [Urine:2310]   PHYSICAL EXAMINATION: General: Elderly male, in NAD lying in bed. Neuro: awake, alert, MAE.  Oriented to self, place / appropriate in conversation HEENT: [place]/AT. PERRL, sclerae anicteric. Cardiovascular: RRR, no M/R/G.  Lungs: even/non-labored on RA, lungs bilaterally diminished but clear Abdomen: BS x 4, soft, NT/ND.  Musculoskeletal: No gross deformities, no  edema.  Skin: Intact, warm, no rashes.  LABS:  BMET  Recent Labs Lab 12/31/15 1453 01/01/16 0225 01/02/16 0229  NA 140 139 142  K 3.9 3.3* 3.3*  CL 104 107 111  CO2 28 25 25   BUN 16 10 6   CREATININE 0.67 0.62 0.65  GLUCOSE 170* 169* 105*    Electrolytes  Recent Labs Lab 12/31/15 1453 01/01/16 0225 01/02/16 0229  CALCIUM 9.2 8.2* 7.9*  MG  --  1.6*  --   PHOS  --  3.4  --     CBC  Recent Labs Lab 12/31/15 1453 01/01/16 0225 01/02/16 0229  WBC 9.3 11.6* 8.5  HGB 14.6 12.1* 10.5*  HCT 42.8 37.6* 33.1*  PLT 211 187 159    Coag's No results for input(s): APTT, INR in the last 168 hours.  Sepsis Markers No results for input(s): LATICACIDVEN, PROCALCITON, O2SATVEN in the last 168 hours.  ABG  Recent Labs Lab 01/01/16 0330  PHART 7.407  PCO2ART 39.1  PO2ART 132*    Liver Enzymes  Recent Labs Lab 12/31/15 1453  AST 21  ALT 17  ALKPHOS 113  BILITOT 0.6  ALBUMIN 4.4    Cardiac Enzymes  Recent Labs Lab 12/31/15 1453  TROPONINI <0.03    Glucose  Recent Labs Lab 12/31/15 1926  GLUCAP 146*    Imaging Dg Chest Port 1 View  Result Date: 01/02/2016 CLINICAL DATA:  Acute hypoxemic respiratory failure,  dementia, COPD EXAM: PORTABLE CHEST 1 VIEW COMPARISON:  Portable exam 0534 hours compared to 01/01/2016 FINDINGS: Rotated to the RIGHT. Previously identified endotracheal and nasogastric tubes no longer seen. Normal heart size, mediastinal contours and pulmonary vascularity. Bibasilar opacities question atelectasis versus infiltrate. Upper lungs clear. No pleural effusion or pneumothorax. Bones demineralized. IMPRESSION: Bibasilar atelectasis versus infiltrate. Electronically Signed   By: Ulyses Southward M.D.   On: 01/02/2016 07:21     STUDIES:  CT chest 8/6 > dilated esophagus likely related to food bolus.  RLL infiltrate.  CULTURES: Blood 8/6 > Sputum 8/6 >   ANTIBIOTICS: Unasyn 8/6 >   SIGNIFICANT EVENTS: 8/6  Admitted due to food  impaction, required intubation in ED due to hypoxia and aspiration.  EGD for impaction removal  LINES/TUBES: ETT 8/6 >> 8/7 (self extubated)  DISCUSSION: 75 y.o. M from independent living facility, admitted 8/6 with food impaction.  Required intubation in ED due to progressive hypoxia which worsened after aspiration.  Transferred to [facility] for GI evaluation including EGD.  ASSESSMENT / PLAN:  PULMONARY A: Acute hypoxic respiratory failure. Aspiration. Hx COPD - no PFT's on file. [name]:   Pulmonary hygiene - Flutter, mobilize DuoNebs in lieu of outpatient spiriva. Budesonide in lieu of outpatient advair. Can return to Spiriva / Advair at discharge  Intermittent CXR to monitor for development of airspace disease Albuterol PRN.   GASTROINTESTINAL A:   Food impaction - s/[name] EGD & removal of steak GI prophylaxis. Nutrition. [name]:   SUP: Pantoprazole, continue for 48 more hours, stop date in place NPO. SLP for swallow evaluation > pt does not have dentures (? If at all).  Likely will need modified texture  CARDIOVASCULAR A:  Hx HLD. [name]:  Continue outpatient ASA.  RENAL A:   Hypokalemia [name]:   Decreased NS to 9ml/hr Replete lytes as indicated, IV KCL until swallow eval Trend BMP / UOP   HEMATOLOGIC A:   VTE Prophylaxis. [name]:  SCD's / Heparin. CBC in AM.  INFECTIOUS A:   Aspiration. [name]:   Abx as above (unasyn).  Follow cultures as above.  ENDOCRINE A:   No acute issues.   [name]:   No interventions required.  NEUROLOGIC A:   Acute encephalopathy. Hx Dementia. Assisted Living Resident  [name]:   Sedation:  n/a RASS goal: 0 Follows up with [facility] program - previously d/w Dr Victory Dakin  Family updated:  No family at bedside am 8/8.    Interdisciplinary Family Meeting v Palliative Care Meeting:  Due by: 8/13.  Global:  Transfer to medical floor and TRH as of am 8/9  Canary Brim, NP-C [facility] Pgr: [phone] or if no answer [phone] 01/02/2016, 8:33  AM

## 2016-01-02 NOTE — Progress Notes (Signed)
PHARMACY - PHYSICIAN COMMUNICATION CRITICAL VALUE ALERT - BLOOD CULTURE IDENTIFICATION (BCID)  Blood cultures with gram + cocci in pairs/clusters BCID didn't work per lab  Results for orders placed or performed during the hospital encounter of 12/31/15  Blood Culture ID Panel (Reflexed) (Collected: 12/31/2015  9:40 PM)  Result Value Ref Range   Enterococcus species NOT DETECTED NOT DETECTED   Vancomycin resistance NOT DETECTED NOT DETECTED   Listeria monocytogenes NOT DETECTED NOT DETECTED   Staphylococcus species NOT DETECTED NOT DETECTED   Staphylococcus aureus NOT DETECTED NOT DETECTED   Methicillin resistance NOT DETECTED NOT DETECTED   Streptococcus species NOT DETECTED NOT DETECTED   Streptococcus agalactiae NOT DETECTED NOT DETECTED   Streptococcus pneumoniae NOT DETECTED NOT DETECTED   Streptococcus pyogenes NOT DETECTED NOT DETECTED   Acinetobacter baumannii NOT DETECTED NOT DETECTED   Enterobacteriaceae species NOT DETECTED NOT DETECTED   Enterobacter cloacae complex NOT DETECTED NOT DETECTED   Escherichia coli NOT DETECTED NOT DETECTED   Klebsiella oxytoca NOT DETECTED NOT DETECTED   Klebsiella pneumoniae NOT DETECTED NOT DETECTED   Proteus species NOT DETECTED NOT DETECTED   Serratia marcescens NOT DETECTED NOT DETECTED   Carbapenem resistance NOT DETECTED NOT DETECTED   Haemophilus influenzae NOT DETECTED NOT DETECTED   Neisseria meningitidis NOT DETECTED NOT DETECTED   Pseudomonas aeruginosa NOT DETECTED NOT DETECTED   Candida albicans NOT DETECTED NOT DETECTED   Candida glabrata NOT DETECTED NOT DETECTED   Candida krusei NOT DETECTED NOT DETECTED   Candida parapsilosis NOT DETECTED NOT DETECTED   Candida tropicalis NOT DETECTED NOT DETECTED    Name of physician (or Provider) Contacted: Dr. Marchelle Gearingamaswamy ([facility])  Changes to prescribed antibiotics required: Continue to monitor on Unasyn  Abran DukeLedford, Davaughn Hillyard 01/02/2016  12:53 AM

## 2016-01-03 LAB — BASIC METABOLIC PANEL
ANION GAP: 8 (ref 5–15)
BUN: 7 mg/dL (ref 6–20)
CALCIUM: 8.3 mg/dL — AB (ref 8.9–10.3)
CO2: 27 mmol/L (ref 22–32)
Chloride: 106 mmol/L (ref 101–111)
Creatinine, Ser: 0.61 mg/dL (ref 0.61–1.24)
Glucose, Bld: 90 mg/dL (ref 65–99)
Potassium: 3.4 mmol/L — ABNORMAL LOW (ref 3.5–5.1)
SODIUM: 141 mmol/L (ref 135–145)

## 2016-01-03 LAB — CBC
HEMATOCRIT: 33.5 % — AB (ref 39.0–52.0)
HEMOGLOBIN: 10.7 g/dL — AB (ref 13.0–17.0)
MCH: 27.5 pg (ref 26.0–34.0)
MCHC: 31.9 g/dL (ref 30.0–36.0)
MCV: 86.1 fL (ref 78.0–100.0)
Platelets: 167 10*3/uL (ref 150–400)
RBC: 3.89 MIL/uL — AB (ref 4.22–5.81)
RDW: 15 % (ref 11.5–15.5)
WBC: 7.8 10*3/uL (ref 4.0–10.5)

## 2016-01-03 LAB — CULTURE, RESPIRATORY W GRAM STAIN: Culture: NORMAL

## 2016-01-03 LAB — CULTURE, BAL-QUANTITATIVE

## 2016-01-03 LAB — CULTURE, BAL-QUANTITATIVE W GRAM STAIN
Culture: 60000 — AB
Special Requests: NORMAL

## 2016-01-03 LAB — CULTURE, RESPIRATORY: SPECIAL REQUESTS: NORMAL

## 2016-01-03 MED ORDER — AMOXICILLIN-POT CLAVULANATE 875-125 MG PO TABS: 1.0000 | ORAL_TABLET | Freq: Two times a day (BID) | ORAL | 0 refills | Status: DC

## 2016-01-03 NOTE — Discharge Summary (Addendum)
Aaron GrillsJohnny P [name], is a 75 y.o. male  DOB 14-Feb-1941  MRN [id number].  Admission date:  12/31/2015  Admitting Physician  Coralyn HellingVineet Sood, MD  Discharge Date:  01/03/2016   Primary MD  No primary care provider on file.  Recommendations for primary care physician for things to follow:   Aspiration pneumonia Augmentin 875mg  po bid x 3 days.   Food impaction s/p EGD  Removal and dilation  Copd Resume advair,  spiriva   Dementia stable  Hypokalemia Will need cmp in 2 weeks  Anemia Mild, will need cbc in 2 weeks.    Admission Diagnosis  aspiration Food impaction   Discharge Diagnosis  aspiration Food impaction    Active Problems:   Acute respiratory failure with hypoxia (HCC)   Food impaction of esophagus   Hyperlipidemia   Acute encephalopathy   Aspiration pneumonia of right lower lobe (HCC)   Acute hypoxemic respiratory failure (HCC)      Past Medical History:  Diagnosis Date  . COPD (chronic obstructive pulmonary disease) (HCC)   . Dementia   . Hyperlipidemia   . Psoriasis     Past Surgical History:  Procedure Laterality Date  . ESOPHAGOGASTRODUODENOSCOPY N/A 12/31/2015   Procedure: ESOPHAGOGASTRODUODENOSCOPY (EGD);  Surgeon: Sherrilyn RistHenry L Danis III, MD;  [facility];  Service: Gastroenterology;  Laterality: N/A;       HPI  from the history and physical done on the day of admission:      75 y.o. male with PMH as outlined below. [facility] and on afternoon of 8/6, [facility] due to presumed food impaction.  He was apparently eating lunch which consisted of cube steak and proceeded to have sensation of food stuck in his chest.  He then had increase sputum production and inability to swallow prompting ED referral.  In ED, he progressively got more hypoxemic and after he had an episode of vomiting followed by probable  aspiration, hypoxia worsened to the point where he required intubation.  [facility] management including consultation by GI for EGD due to food impaction.     Hospital Course:       Pt was evaluated and hypoxia thought to be secondary to aspiration pneumonia. Intubated. CT chest 8/6 > dilated esophagus likely related to food bolus.  RLL infiltrate.  Extubated on  01/01/2016 Started on augmentin.  Pt had EGD 12/31/2015 to remove food impaction.  Bronchoscopy 01/01/2016=> secretions removed. Blood culture showed 1/2 gpc pairs and clusters.    Pt has been afebrile.    Follow UP     Consults obtained - Pccm, GI  Discharge Condition: stable  Diet and Activity recommendation: See Discharge Instructions below  Discharge Instructions         Discharge Medications       Medication List    TAKE these medications   amoxicillin-clavulanate 875-125 MG tablet Commonly known as:  AUGMENTIN Take 1 tablet by mouth 2 (  two) times daily.   aspirin EC 81 MG tablet Take 81 mg by mouth daily.   atorvastatin 20 MG tablet Commonly known as:  LIPITOR Take 20 mg by mouth daily at 6 PM.   clobetasol cream 0.05 % Commonly known as:  TEMOVATE Apply 1 application topically every 12 (twelve) hours as needed (rash on back).   DHS TAR 0.5 % shampoo Generic drug:  coal tar Apply 1 application topically See admin instructions. Apply to hair topically every Sunday and Wednesday evening shift   Fluticasone-Salmeterol 250-50 MCG/DOSE Aepb Commonly known as:  ADVAIR Inhale 1 puff into the lungs 2 (two) times daily.   latanoprost 0.005 % ophthalmic solution Commonly known as:  XALATAN Place 1 drop into both eyes at bedtime.   loratadine 10 MG tablet Commonly known as:  CLARITIN Take 10 mg by mouth daily as needed for allergies.   PROAIR HFA 108 (90 Base) MCG/ACT inhaler Generic drug:  albuterol Inhale 2 puffs into the lungs every 4 (four) hours as needed for  wheezing or shortness of breath.   tiotropium 18 MCG inhalation capsule Commonly known as:  SPIRIVA Place 18 mcg into inhaler and inhale daily.   Vitamin D (Ergocalciferol) 50000 units Caps capsule Commonly known as:  DRISDOL Take 50,000 Units by mouth every 30 (thirty) days. On the 30th of each month       Major procedures and Radiology Reports - PLEASE review detailed and final reports for all details, in brief -      Dg Neck Soft Tissue  Result Date: 12/31/2015 CLINICAL DATA:  75 year old male with possible ingested foreign body EXAM: NECK SOFT TISSUES - 1+ VIEW COMPARISON:  None. FINDINGS: There is no evidence of retropharyngeal soft tissue swelling or epiglottic enlargement. The cervical airway is unremarkable and no radio-opaque foreign body identified. Multilevel cervical spondylosis with degenerative disc disease and anterior osteophytes. Calcifications present in the carotid arteries bilaterally. The patient is edentulous. Remote healed fracture of the right mid clavicle. Visualized lung apices are unremarkable. IMPRESSION: 1. No radiopaque foreign body or evidence of airway obstruction. 2. Bilateral carotid artery atherosclerotic calcifications. 3. Multilevel cervical spondylosis. 4. Remote healed right mid clavicular fracture. Electronically Signed   By: Malachy Moan M.D.   On: 12/31/2015 15:23   Ct Soft Tissue Neck Wo Contrast  Result Date: 12/31/2015 CLINICAL DATA:  Feels as though steak is stuck in throat with possible aspiration. EXAM: CT NECK WITHOUT CONTRAST TECHNIQUE: Multidetector CT imaging of the neck was performed following the standard protocol without intravenous contrast. COMPARISON:  None. FINDINGS: Pharynx and larynx: Within normal limits. Salivary glands: Within normal limits. Thyroid: Within normal limits. Lymph nodes: No significant lymphadenopathy is noted. Vascular: Carotid calcifications are seen slightly greater on the left than the right. Limited  intracranial: No acute intracranial abnormality is noted. Visualized orbits: Visualized orbits are within normal limits. Mastoids and visualized paranasal sinuses: Some mucosal changes are seen within the paranasal sinuses without definitive air-fluid level. Skeleton: Degenerative changes of the thoracic and cervical spine are noted. No acute bony abnormality is seen. Upper chest: Mild scarring in the apices without acute abnormality within the lungs. There is again identified dilatation of the proximal esophagus with fluid attenuation likely related to a distal food bolus IMPRESSION: Somewhat limited exam due to the lack of IV contrast. Dilatation of the proximal esophagus is again identified as previously described on the chest CT. No acute abnormality within the neck is noted. Specifically no foreign body is noted within  the proximal airway Electronically Signed   By: Alcide Clever M.D.   On: 12/31/2015 16:21   Ct Chest Wo Contrast  Result Date: 12/31/2015 CLINICAL DATA:  Feels as though state gaze stuck in throat with difficulty breathing and swallowing saliva EXAM: CT CHEST WITHOUT CONTRAST TECHNIQUE: Multidetector CT imaging of the chest was performed following the standard protocol without IV contrast. COMPARISON:  08/06/2007 FINDINGS: Cardiovascular: Significantly limited by lack of IV contrast. Aortic calcifications are seen without aneurysmal dilatation. Heavy coronary calcifications are noted. Mediastinum/Nodes: The esophagus is significantly distended with soft tissue density throughout the chest. It is likely that there is a distal food bolus stuck at the level of the gastroesophageal junction with back up of fluids proximally. No significant lymphadenopathy is noted. The thoracic inlet is within normal limits. Lungs/Pleura: The lungs are well aerated bilaterally. Patchy infiltrative changes are seen in the right lower lobe as well as suggestion of a filling defect within the lower lobe bronchial tree  consistent with the given clinical history. An 8 mm right lower lobe nodule is seen. No other significant nodular densities are noted. No other focal infiltrate is seen. No effusion is noted. Upper Abdomen: Visualized upper abdomen is within normal limits. Musculoskeletal: Degenerative changes of the thoracic spine are seen. No compression deformity is noted. No acute rib abnormality is seen. IMPRESSION: Dilated esophagus likely related to distal food bolus with back up of fluid within the esophagus. Direct visualization is recommended. Right lower lobe infiltrative change and suggestion of a small filling defect within the lower lobe bronchial tree best seen on image number 81 of series 4. This would be consistent with aspiration. Right lower lobe nodule which is stable from 2009 and consistent with a benign etiology. Electronically Signed   By: Alcide Clever M.D.   On: 12/31/2015 16:18   Dg Chest Port 1 View  Result Date: 01/02/2016 CLINICAL DATA:  Acute hypoxemic respiratory failure, dementia, COPD EXAM: PORTABLE CHEST 1 VIEW COMPARISON:  Portable exam 0534 hours compared to 01/01/2016 FINDINGS: Rotated to the RIGHT. Previously identified endotracheal and nasogastric tubes no longer seen. Normal heart size, mediastinal contours and pulmonary vascularity. Bibasilar opacities question atelectasis versus infiltrate. Upper lungs clear. No pleural effusion or pneumothorax. Bones demineralized. IMPRESSION: Bibasilar atelectasis versus infiltrate. Electronically Signed   By: Ulyses Southward M.D.   On: 01/02/2016 07:21   Dg Chest Port 1 View  Result Date: 01/01/2016 CLINICAL DATA:  Respiratory failure. EXAM: PORTABLE CHEST 1 VIEW COMPARISON:  01/09/2017. FINDINGS: Interim placement of NG tube, its tip is below left hemidiaphragm. Endotracheal tube in stable position. Persistent right lower lobe atelectasis and consolidation. No pleural effusion or pneumothorax. Heart size stable. IMPRESSION: 1. Interim placement of NG  tube, its tip is below left hemidiaphragm. Endotracheal tube in stable position. 2. Persistent right lower lobe consolidation and atelectasis. Electronically Signed   By: Maisie Fus  Register   On: 01/01/2016 06:53   Dg Chest Portable 1 View  Result Date: 12/31/2015 CLINICAL DATA:  Patient status post intubation.  Aspiration. EXAM: PORTABLE CHEST 1 VIEW COMPARISON:  Chest CT earlier same day FINDINGS: Patient is markedly rotated to the right. ET tube terminates in the mid trachea. Stable cardiac and mediastinal contours. Worsening consolidation within the right mid and lower lung. No definite pleural effusion or pneumothorax. IMPRESSION: ET tube terminates in the mid trachea. Worsening right mid and lower lung consolidation. Electronically Signed   By: Annia Belt M.D.   On: 12/31/2015 18:32   Dg  Chest Portable 1 View  Result Date: 12/31/2015 CLINICAL DATA:  Patient with aspiration.  Cough. EXAM: PORTABLE CHEST 1 VIEW COMPARISON:  Chest radiograph 02/18/2014 FINDINGS: Patient is markedly rotated, limiting evaluation. Stable cardiac and mediastinal contours. No large area of pulmonary consolidation. No pleural effusion or pneumothorax. Regional skeleton is unremarkable. IMPRESSION: Markedly limited rotated exam. Recommend correlation with PA and lateral chest radiograph when patient clinically able. Within the above limitation, no large area of pulmonary consolidation. Electronically Signed   By: Annia Belt M.D.   On: 12/31/2015 15:26   Dg Abd Portable 1v  Result Date: 01/01/2016 CLINICAL DATA:  Feeding tube placement.  Initial encounter. EXAM: PORTABLE ABDOMEN - 1 VIEW COMPARISON:  None. FINDINGS: The patient's enteric tube is noted ending overlying the body of the stomach. The visualized bowel gas pattern is grossly unremarkable. A small amount of stool is seen in the colon. No free intra-abdominal air is seen, though evaluation free air is limited on a single supine view. No acute osseous abnormalities are  identified. The visualized lung bases are grossly clear. IMPRESSION: Enteric tube noted ending overlying the body of the stomach. Electronically Signed   By: Roanna Raider M.D.   On: 01/01/2016 01:14    Micro Results     Recent Results (from the past 240 hour(s))  MRSA PCR Screening     Status: None   Collection Time: 12/31/15  7:49 PM  Result Value Ref Range Status   MRSA by PCR NEGATIVE NEGATIVE Final    Comment:        The GeneXpert MRSA Assay (FDA approved for NASAL specimens only), is one component of a comprehensive MRSA colonization surveillance program. It is not intended to diagnose MRSA infection nor to guide or monitor treatment for MRSA infections.   Culture, blood (Routine X 2) w Reflex to ID Panel     Status: None (Preliminary result)   Collection Time: 12/31/15  9:35 PM  Result Value Ref Range Status   Specimen Description BLOOD LEFT HAND  Final   Special Requests BOTTLES DRAWN AEROBIC ONLY 5CC  Final   Culture NO GROWTH 3 DAYS  Final   Report Status PENDING  Incomplete  Culture, blood (Routine X 2) w Reflex to ID Panel     Status: Abnormal (Preliminary result)   Collection Time: 12/31/15  9:40 PM  Result Value Ref Range Status   Specimen Description BLOOD RIGHT HAND  Final   Special Requests BOTTLES DRAWN AEROBIC AND ANAEROBIC 5CC  Final   Culture  Setup Time   Final    GRAM POSITIVE COCCI IN PAIRS IN CLUSTERS ANAEROBIC BOTTLE ONLY CRITICAL RESULT CALLED TO, READ BACK BY AND VERIFIED WITH: Akasha Melena LEDFORD,PHARMD @0024  01/02/16 MKELLY    Culture (A)  Final    STAPHYLOCOCCUS SPECIES (COAGULASE NEGATIVE) THE SIGNIFICANCE OF ISOLATING THIS ORGANISM FROM A SINGLE SET OF BLOOD CULTURES WHEN MULTIPLE SETS ARE DRAWN IS UNCERTAIN. PLEASE NOTIFY THE MICROBIOLOGY DEPARTMENT WITHIN ONE WEEK IF SPECIATION AND SENSITIVITIES ARE REQUIRED.    Report Status PENDING  Incomplete  Blood Culture ID Panel (Reflexed)     Status: None   Collection Time: 12/31/15  9:40 PM    Result Value Ref Range Status   Enterococcus species NOT DETECTED NOT DETECTED Final   Vancomycin resistance NOT DETECTED NOT DETECTED Final   Listeria monocytogenes NOT DETECTED NOT DETECTED Final   Staphylococcus species NOT DETECTED NOT DETECTED Final   Staphylococcus aureus NOT DETECTED NOT DETECTED Final   Methicillin resistance  NOT DETECTED NOT DETECTED Final   Streptococcus species NOT DETECTED NOT DETECTED Final   Streptococcus agalactiae NOT DETECTED NOT DETECTED Final   Streptococcus pneumoniae NOT DETECTED NOT DETECTED Final   Streptococcus pyogenes NOT DETECTED NOT DETECTED Final   Acinetobacter baumannii NOT DETECTED NOT DETECTED Final   Enterobacteriaceae species NOT DETECTED NOT DETECTED Final   Enterobacter cloacae complex NOT DETECTED NOT DETECTED Final   Escherichia coli NOT DETECTED NOT DETECTED Final   Klebsiella oxytoca NOT DETECTED NOT DETECTED Final   Klebsiella pneumoniae NOT DETECTED NOT DETECTED Final   Proteus species NOT DETECTED NOT DETECTED Final   Serratia marcescens NOT DETECTED NOT DETECTED Final   Carbapenem resistance NOT DETECTED NOT DETECTED Final   Haemophilus influenzae NOT DETECTED NOT DETECTED Final   Neisseria meningitidis NOT DETECTED NOT DETECTED Final   Pseudomonas aeruginosa NOT DETECTED NOT DETECTED Final   Candida albicans NOT DETECTED NOT DETECTED Final   Candida glabrata NOT DETECTED NOT DETECTED Final   Candida krusei NOT DETECTED NOT DETECTED Final   Candida parapsilosis NOT DETECTED NOT DETECTED Final   Candida tropicalis NOT DETECTED NOT DETECTED Final  Culture, respiratory (NON-Expectorated)     Status: None   Collection Time: 01/01/16 12:25 PM  Result Value Ref Range Status   Specimen Description TRACHEAL ASPIRATE  Final   Special Requests Normal  Final   Gram Stain   Final    MODERATE WBC PRESENT, PREDOMINANTLY PMN NO SQUAMOUS EPITHELIAL CELLS SEEN FEW GRAM POSITIVE COCCI RARE GRAM NEGATIVE RODS    Culture Consistent  with normal respiratory flora.  Final   Report Status 01/03/2016 FINAL  Final  Culture, bal-quantitative     Status: Abnormal   Collection Time: 01/01/16  1:35 PM  Result Value Ref Range Status   Specimen Description BRONCHIAL ALVEOLAR LAVAGE  Final   Special Requests Normal  Final   Gram Stain   Final    ABUNDANT WBC PRESENT, PREDOMINANTLY PMN MODERATE GRAM POSITIVE COCCI IN PAIRS IN CLUSTERS IN CHAINS FEW GRAM NEGATIVE RODS    Culture (A)  Final    60,000 COLONIES/mL Consistent with normal respiratory flora.   Report Status 01/03/2016 FINAL  Final       Today   Subjective    Aaron [name] today has no headache,no chest abdominal pain,no new weakness tingling or numbness, feels much better wants to go home today.    Objective   Blood pressure 121/76, pulse 83, temperature 98.4 F (36.9 C), temperature source Oral, resp. rate 18, weight 56.4 kg (124 lb 6 oz), SpO2 100 %.   Intake/Output Summary (Last 24 hours) at 01/03/16 1552 Last data filed at 01/03/16 0426  Gross per 24 hour  Intake              150 ml  Output             1060 ml  Net             -910 ml    Exam Awake Alert, Oriented x 2, No new F.N deficits, Normal affect Cobb.AT,PERRAL Supple Neck,No JVD, No cervical lymphadenopathy appriciated.  Symmetrical Chest wall movement, Good air movement bilaterally, CTAB RRR,No Gallops,Rubs or new Murmurs, No Parasternal Heave +ve B.Sounds, Abd Soft, Non tender, No organomegaly appriciated, No rebound -guarding or rigidity. No Cyanosis, Clubbing or edema, No new Rash or bruise Gait instability   Data Review   CBC w Diff: Lab Results  Component Value Date   WBC 7.8 01/03/2016  HGB 10.7 (L) 01/03/2016   HGB 14.3 02/18/2014   HCT 33.5 (L) 01/03/2016   HCT 43.2 02/18/2014   PLT 167 01/03/2016   PLT 200 02/18/2014   LYMPHOPCT 12 12/31/2015   MONOPCT 6 12/31/2015   EOSPCT 4 12/31/2015   BASOPCT 1 12/31/2015    CMP: Lab Results  Component Value Date   NA  141 01/03/2016   NA 140 02/18/2014   K 3.4 (L) 01/03/2016   K 3.7 02/18/2014   CL 106 01/03/2016   CL 107 02/18/2014   CO2 27 01/03/2016   CO2 30 02/18/2014   BUN 7 01/03/2016   BUN 13 02/18/2014   CREATININE 0.61 01/03/2016   CREATININE 0.93 02/18/2014   PROT 7.5 12/31/2015   ALBUMIN 4.4 12/31/2015   BILITOT 0.6 12/31/2015   ALKPHOS 113 12/31/2015   AST 21 12/31/2015   ALT 17 12/31/2015  .   Total Time in preparing paper work, data evaluation and todays exam - 35 minutes  Pearson Grippe M.D on 01/03/2016 at 3:52 PM  [facility]   Office  [phone]

## 2016-01-03 NOTE — Progress Notes (Signed)
Pharmacy Antibiotic Note  Aaron RimJohnny P Revonda [name] is a 75 y.o. male transferred from [place] for urgent EGD s/p food impaction. CXR shows worsening right mid and lower lung consolidation.  Unasyn continues for possible aspiration pneumonia. Noted plans for 5-7 days coverage.   Plan: -Continue Unasyn 3g IV q8 -Will follow renal function, cultures and clinical progress   Weight: 124 lb 6 oz (56.4 kg)  Temp (24hrs), Avg:98.1 F (36.7 C), Min:97.5 F (36.4 C), Max:98.9 F (37.2 C)   Recent Labs Lab 12/31/15 1453 01/01/16 0225 01/02/16 0229 01/03/16 0309  WBC 9.3 11.6* 8.5 7.8  CREATININE 0.67 0.62 0.65 0.61    Estimated Creatinine Clearance: 64.6 mL/min (by C-G formula based on SCr of 0.8 mg/dL).    No Known Allergies  Antimicrobials this admission: 8/6 Unasyn >>  Microbiology results: 8/6 blood x2- GPC 1/2 8/7 BAL- GPC/pairs and clusters, GNR  Thank you for allowing pharmacy to be a part of this patient's care. Harland GermanAndrew Daylah Sayavong, Pharm D 01/03/2016 8:29 AM

## 2016-01-03 NOTE — Evaluation (Signed)
Physical Therapy Evaluation Patient Details Name: Aaron GrillsJohnny P [name] MRN: [id number] DOB: 05/03/1941 Today's Date: 01/03/2016   History of Present Illness  75 yo male with admission for choking on food causing hypoxic respiratory failure, encephalopathy and intubation was given EGD to extract the food.   Had NG and PEG tubes at one point.  Pt was intubated 8/6 and self-extubated 8/7.  Clinical Impression  Pt is unsafe to walk with one person due to cognitive changes and his state of mobility.  Due to PLOF would recommend transition to SNF to increase safe mobility and progress back to his assisted environment.  He may be ALF level of care now instead of ILF.  Continue acutely to increase his strength and will need 2 person assist to safely mobilize on RW.      Follow Up Recommendations SNF    Equipment Recommendations  None recommended by PT    Recommendations for Other Services Rehab consult     Precautions / Restrictions Precautions Precautions: Fall (telemetry) Restrictions Weight Bearing Restrictions: No      Mobility  Bed Mobility Overal bed mobility: Needs Assistance Bed Mobility: Supine to Sit;Sit to Supine     Supine to sit: Min assist Sit to supine: Mod assist   General bed mobility comments: Pt is very confident he can move alone but not able to do the motor planning and has generalized weakness  Transfers Overall transfer level: Needs assistance Equipment used: 1 person hand held assist (worked to replicate [employer] Cla[employer]PLOF ) Transfers: Sit to/from RaytheonStand;Stand Pivot Transfers Sit to Stand: Mod assist;Min assist Stand pivot transfers: Min assist;Mod assist       General transfer comment: Pt is unable to direct his efforts consistently  Ambulation/Gait Ambulation/Gait assistance: Min assist;Mod assist (brief moment of max assist as he leaned back unsafely) Ambulation Distance (Feet): 30 Feet Assistive device: 1 person hand held assist (walker would be helpful but pt  cannot direct his efforts) Gait Pattern/deviations: Step-through pattern;Step-to pattern;Decreased stride length;Shuffle;Ataxic;Staggering left;Staggering right;Wide base of support Gait velocity: reduced Gait velocity interpretation: Below normal speed for age/gender General Gait Details: Pt became unable to progress gait and nearly froze, leaned backward into PT as he was stating he was moving in another direction.  Stairs            Wheelchair Mobility    Modified Rankin (Stroke Patients Only)       Balance Overall balance assessment: History of Falls;Needs assistance Sitting-balance support: Feet supported Sitting balance-Leahy Scale: Fair     Standing balance support: Bilateral upper extremity supported Standing balance-Leahy Scale: Poor                               Pertinent Vitals/Pain Pain Assessment: Faces Pain Score: 0-No pain    Home Living Family/patient expects to be discharged to:: Unsure                      Prior Function Level of Independence: Independent (pt not reporting using an AD but has dementia)               Hand Dominance        Extremity/Trunk Assessment   Upper Extremity Assessment: Generalized weakness           Lower Extremity Assessment: Generalized weakness      Cervical / Trunk Assessment: Kyphotic  Communication      Cognition Arousal/Alertness: Awake/alert Behavior During Therapy:  Impulsive Overall Cognitive Status: History of cognitive impairments - at baseline       Memory: Decreased short-term memory;Decreased recall of precautions              General Comments General comments (skin integrity, edema, etc.): Pt is demonstrating some lack of awareness of his mobility capabilities, having become unable to move directions and feeling like he was doing fine.  Lists backward very unsafely and will be a high fall risk.    Exercises        Assessment/Plan    PT Assessment  Patient needs continued PT services  PT Diagnosis Difficulty walking;Generalized weakness   PT Problem List Decreased strength;Decreased range of motion;Decreased activity tolerance;Decreased balance;Decreased mobility;Decreased coordination;Decreased cognition;Decreased knowledge of use of DME;Decreased safety awareness;Decreased knowledge of precautions;Cardiopulmonary status limiting activity;Decreased skin integrity;Pain  PT Treatment Interventions DME instruction;Gait training;Functional mobility training;Therapeutic activities;Therapeutic exercise;Neuromuscular re-education;Balance training;Patient/family education   PT Goals (Current goals can be found in the Care Plan section) Acute Rehab PT Goals Patient Stated Goal: to walk with no assistance PT Goal Formulation: With patient Time For Goal Achievement: 01/17/16 Potential to Achieve Goals: Good    Frequency Min 2X/week   Barriers to discharge Decreased caregiver support (living in [place] needs continual help to walk or move    Co-evaluation               End of Session Equipment Utilized During Treatment: Gait belt Activity Tolerance: Patient limited by fatigue;Patient limited by lethargy Patient left: in bed;with call bell/phone within reach;with bed alarm set Nurse Communication: Mobility status         Time: 1610-9604 PT Time Calculation (min) (ACUTE ONLY): 28 min   Charges:   PT Evaluation $PT Eval Moderate Complexity: 1 Procedure PT Treatments $Gait Training: 8-22 mins   PT G Codes:        Ivar Drape 01-24-16, 2:00 PM    Samul Dada, PT MS Acute Rehab Dept. Number: [facility] [id number] and [facility] [phone]

## 2016-01-03 NOTE — Discharge Instructions (Signed)
Please follow up with your primary care in 1-2 weeks.

## 2016-01-03 NOTE — Care Management Important Message (Signed)
Important Message  Patient Details  Name: Aaron [name] MRN: [id number] Date of Birth: 1941-04-13   Medicare Important Message Given:  Yes    [name], Stephan MinisterSusan Coleman 01/03/2016, 2:52 PM

## 2016-01-03 NOTE — NC FL2 (Signed)
[place] MEDICAID FL2 LEVEL OF CARE SCREENING TOOL     IDENTIFICATION  Patient Name: Aaron GrillsJohnny P [name] Birthdate: [birth year] Sex: male Admission Date (Current Location): 12/31/2015  [facility] [place]   Facility and Address:  [facility] [place], [address], [place], [place] [address]      Provider Number: [id number]  Attending Physician Name and Address:  Pearson GrippeJames Kim, MD  Relative Name and Phone Number:       Current Level of Care: Hospital Recommended Level of Care: Memory Care Prior Approval Number:    Date Approved/Denied:   PASRR Number: [id number]  Discharge Plan: SNF    Current Diagnoses: Patient Active Problem List   Diagnosis Date Noted  . Acute hypoxemic respiratory failure (HCC) 01/01/2016  . Acute respiratory failure with hypoxia (HCC)   . Food impaction of esophagus   . Hyperlipidemia   . Acute encephalopathy   . Aspiration pneumonia of right lower lobe (HCC)     Orientation RESPIRATION BLADDER Height & Weight     Self, Time, Situation, Place  Normal Indwelling catheter Weight: 124 lb 6 oz (56.4 kg) Height:     BEHAVIORAL SYMPTOMS/MOOD NEUROLOGICAL BOWEL NUTRITION STATUS   (none)  (None) Continent Diet (Diet DYS 2)  AMBULATORY STATUS COMMUNICATION OF NEEDS Skin   Limited Assist Verbally Normal                       Personal Care Assistance Level of Assistance  Bathing, Feeding, Dressing Bathing Assistance: Limited assistance Feeding assistance: Independent Dressing Assistance: Limited assistance     Functional Limitations Info  Speech, Hearing, Sight Sight Info: Adequate Hearing Info: Adequate Speech Info: Adequate    SPECIAL CARE FACTORS FREQUENCY  PT (By licensed PT), OT (By licensed OT)     PT Frequency: 3/ week OT Frequency: 3/ week            Contractures      Additional Factors Info  Code Status, Allergies Code Status Info: FULL Allergies Info: NKDA           Current  Medications (01/03/2016):  This is the current hospital active medication list Current Facility-Administered Medications  Medication Dose Route Frequency Provider Last Rate Last Dose  . 0.9 %  sodium chloride infusion   Intravenous Continuous Jeanella CrazeBrandi L Ollis, NP 50 mL/hr at 01/02/16 2109    . 0.9 %  sodium chloride infusion  250 mL Intravenous PRN Rahul P Desai, PA-C      . albuterol (PROVENTIL) (2.5 MG/3ML) 0.083% nebulizer solution 2.5 mg  2.5 mg Nebulization Q3H PRN Rahul P Desai, PA-C      . Ampicillin-Sulbactam (UNASYN) 3 g in sodium chloride 0.9 % 100 mL IVPB  3 g Intravenous Q8H Emi HolesJennifer S Markle, RPH   3 g at 01/03/16 1347  . antiseptic oral rinse (CPC / CETYLPYRIDINIUM CHLORIDE 0.05%) solution 7 mL  7 mL Mouth Rinse BID Oretha Milchakesh V Alva, MD      . aspirin chewable tablet 81 mg  81 mg Per Tube Daily Rahul P Desai, PA-C   81 mg at 01/03/16 0938  . atorvastatin (LIPITOR) tablet 20 mg  20 mg Oral q1800 Jeanella CrazeBrandi L Ollis, NP      . budesonide (PULMICORT) nebulizer solution 0.5 mg  0.5 mg Nebulization BID Rahul P Desai, PA-C   0.5 mg at 01/03/16 0823  . chlorhexidine (PERIDEX) 0.12 % solution 15 mL  15 mL Mouth Rinse BID Kathy Breachakesh  Wallace Keller, MD   15 mL at 01/03/16 0938  . heparin injection 5,000 Units  5,000 Units Subcutaneous Q8H Rahul P Desai, PA-C   5,000 Units at 01/03/16 1348  . ipratropium-albuterol (DUONEB) 0.5-2.5 (3) MG/3ML nebulizer solution 3 mL  3 mL Nebulization QID Oretha Milch, MD   3 mL at 01/03/16 1221  . latanoprost (XALATAN) 0.005 % ophthalmic solution 1 drop  1 drop Both Eyes QHS Jeanella Craze, NP         Discharge Medications: Please see discharge summary for a list of discharge medications.  Relevant Imaging Results:  Relevant Lab Results:   Additional Information SSN: [id number]  [facility]gy [facility], LCSW

## 2016-01-03 NOTE — Clinical Social Work Note (Signed)
Per MD patient is ready to [facility] [phone]. RN, patient, patient's family, and facility notified of discharge. CSW spoke with Elliot Dallyy Jones Associate Director [facility] she is aware patient is discharging today back to their facility. Patient's daughter April Oakley [phone] will provide transportation [facility]. CSW signing off.

## 2016-01-03 NOTE — Clinical Social Work Note (Signed)
Clinical Social Work Assessment  Patient Details  Name: Aaron [name] MRN: [id number] Date of Birth: 09-20-1940  Date of referral:                  Reason for consult:  Discharge Planning                Permission sought to share information with:  Facility Medical sales representativeContact Representative, Family Supports Permission granted to share information::     Name::     Aaron [name]  Agency::  ALF- [facility]  Relationship::  Daughter  Contact Information:     Housing/Transportation Living arrangements for the past 2 months:  Skilled Building surveyorursing Facility Source of Information:  Adult Children Patient Interpreter Needed:  None Criminal Activity/Legal Involvement Pertinent to Current Situation/Hospitalization:  No - Comment as needed Significant Relationships:  Adult Children Lives with:  Facility Resident Do you feel safe going back to the place where you live?  Yes Need for family participation in patient care:  Yes (Comment)  Care giving concerns: CSW spoke with patient daughter Aaron Okaly [phone]. She reported [facility]. She reported she would [facility] patient [facility].    Social Worker assessment / plan:  CSW spoke with patient's daughter. CSW explained recommendations of PT. Patient's daughter reported she would like for patient to return to ALF if possible. [facility] CSW is waiting on [facility] decision to take patient back.   Employment status:  Retired Database administratornsurance information:  Managed Medicare PT Recommendations:  Skilled Nursing Facility Information / Referral to community resources:  Skilled Nursing Facility  Patient/Family's Response to care:  The patient's daughter is happy with the care the patient has received.   Patient/Family's Understanding of and Emotional Response to Diagnosis, Current Treatment, and Prognosis:  The patient's daughter has a good understanding of why the patient was admitted.  She  understands the care plan and what the patient will need post discharge. Emotional Assessment Appearance:   (Unable to Assess) Attitude/Demeanor/Rapport:    Affect (typically observed):  Accepting, Calm, Appropriate Orientation:  Oriented to Self Alcohol / Substance use:  Not Applicable Psych involvement (Current and /or in the community):  No (Comment)  Discharge Needs  Concerns to be addressed:  Discharge Planning Concerns Readmission within the last 30 days:  No Current discharge risk:  None Barriers to Discharge:  No Barriers Identified   Reggy EyeLaShonda A Ariatna Jester, LCSW 01/03/2016, 2:48 PM

## 2016-01-03 NOTE — Progress Notes (Signed)
Speech Language Pathology Treatment: Dysphagia  Patient Details Name: Aaron [name] MRN: [id number] DOB: September 04, 1940 Today's Date: 01/03/2016 Time: [id number] SLP Time Calculation (min) (ACUTE ONLY): 13 min  Assessment / Plan / Recommendation Clinical Impression  Dysphagia treatment provided for diet tolerance/ consider advancement. Pt showed no overt s/s of aspiration with trials of thin liquid or soft solid, appeared to masticate and swallow without overt difficulty. Pt alert at bedside, conversant and pleasantly confused. Continues to need hand over hand assist, at times full assist, with feeding. Given cognitive status, lack of dentition and EGD showing esophageal stenosis, recommend dysphagia 2 diet, thin liquids, continue meds crushed in puree, continue full supervision with meals to assist with feeding/ cue small bites and sips. Will continue to follow for diet tolerance.    HPI HPI: 75 y.o.M from independent living facility, admitted 8/6 with food impaction requiring EGD for removal. He required intubation 8/6 due to progressive hypoxia which worsened after aspiration. he self-extubated 8/7. PMH of psoriasis, HLD, dementia and COPD      SLP Plan  Continue with current plan of care     Recommendations  Diet recommendations: Dysphagia 2 (fine chop);Thin liquid Liquids provided via: Cup;Straw Medication Administration: Crushed with puree Supervision: Staff to assist with self feeding;Full supervision/cueing for compensatory strategies Compensations: Minimize environmental distractions;Slow rate;Small sips/bites;Follow solids with liquid Postural Changes and/or Swallow Maneuvers: Seated upright 90 degrees;Upright 30-60 min after meal             Oral Care Recommendations: Oral care BID Follow up Recommendations: Skilled Nursing facility Plan: Continue with current plan of care     GO                Metro KungOleksiak, [name], MA, CCC-SLP 01/03/2016, 10:31 AM [phone]

## 2016-01-03 NOTE — Care Management Note (Signed)
Case Management Note Previous CM note initiated by Raynald BlendSamantha Claxton RN, CM  Patient Details  Name: Aaron [name] MRN: [id number] Date of Birth: [birth year]  Subjective/Objective:   Pt admitted post aspiration event from food impaction                 Action/Plan:  [facility]  CM will continue to follow for discharge needs - will consult with CSW of possible referral depending on progression of discharge plan - possibly SNF.  CM spoke directly with [facility] Case Manager Aaron [name] [phone] - Pt active with [facility] [place].  CM will continue to follow for discharge needs   Expected Discharge Date:     01/03/16             Expected Discharge Plan:  Assisted Living / Rest Home  In-House Referral:  Clinical Social Work  Discharge planning Services  CM Consult  Post Acute Care Choice:    Choice offered to:     DME Arranged:    DME Agency:     HH Arranged:    HH Agency:     Status of Service:  Completed, signed off  If discussed at [employer] of [employer] Meetings, dates discussed:    Additional Comments:  01/03/16- 1600- Rhiley Tarver RN, CM- per PT notes recommendations for SNF- however [facility] [place] [facility]- per CSW family would like pt to return to ALF- [facility]- CSW following for d/c needs.  Darrold SpanWebster, Bernardina Cacho Hall, RN 01/03/2016, 4:26 PM

## 2016-01-04 LAB — CULTURE, BLOOD (ROUTINE X 2)

## 2016-01-05 LAB — CULTURE, BLOOD (ROUTINE X 2): Culture: NO GROWTH

## 2016-10-11 ENCOUNTER — Inpatient Hospital Stay
Admission: EM | Admit: 2016-10-11 | Discharge: 2016-10-14 | DRG: 177 | Disposition: A | Discharge status: Skilled Nursing Facility | Payer: Medicare (Managed Care) | Attending: Internal Medicine | Admitting: Internal Medicine

## 2016-10-11 ENCOUNTER — Encounter: Payer: Self-pay | Admitting: Emergency Medicine

## 2016-10-11 ENCOUNTER — Emergency Department: Payer: Medicare (Managed Care)

## 2016-10-11 DIAGNOSIS — Z87891 Personal history of nicotine dependence: Secondary | ICD-10-CM

## 2016-10-11 DIAGNOSIS — Z7982 Long term (current) use of aspirin: Secondary | ICD-10-CM | POA: Diagnosis not present

## 2016-10-11 DIAGNOSIS — R0603 Acute respiratory distress: Secondary | ICD-10-CM | POA: Diagnosis not present

## 2016-10-11 DIAGNOSIS — E119 Type 2 diabetes mellitus without complications: Secondary | ICD-10-CM | POA: Diagnosis present

## 2016-10-11 DIAGNOSIS — J690 Pneumonitis due to inhalation of food and vomit: Principal | ICD-10-CM | POA: Diagnosis present

## 2016-10-11 DIAGNOSIS — J449 Chronic obstructive pulmonary disease, unspecified: Secondary | ICD-10-CM | POA: Diagnosis present

## 2016-10-11 DIAGNOSIS — J9601 Acute respiratory failure with hypoxia: Secondary | ICD-10-CM | POA: Diagnosis present

## 2016-10-11 DIAGNOSIS — J9600 Acute respiratory failure, unspecified whether with hypoxia or hypercapnia: Secondary | ICD-10-CM | POA: Diagnosis present

## 2016-10-11 DIAGNOSIS — L409 Psoriasis, unspecified: Secondary | ICD-10-CM | POA: Diagnosis present

## 2016-10-11 DIAGNOSIS — F0390 Unspecified dementia without behavioral disturbance: Secondary | ICD-10-CM | POA: Diagnosis present

## 2016-10-11 DIAGNOSIS — T17908A Unspecified foreign body in respiratory tract, part unspecified causing other injury, initial encounter: Secondary | ICD-10-CM

## 2016-10-11 DIAGNOSIS — Z7951 Long term (current) use of inhaled steroids: Secondary | ICD-10-CM | POA: Diagnosis not present

## 2016-10-11 DIAGNOSIS — H409 Unspecified glaucoma: Secondary | ICD-10-CM | POA: Diagnosis present

## 2016-10-11 DIAGNOSIS — R0989 Other specified symptoms and signs involving the circulatory and respiratory systems: Secondary | ICD-10-CM

## 2016-10-11 DIAGNOSIS — E785 Hyperlipidemia, unspecified: Secondary | ICD-10-CM | POA: Diagnosis present

## 2016-10-11 DIAGNOSIS — Z8249 Family history of ischemic heart disease and other diseases of the circulatory system: Secondary | ICD-10-CM | POA: Diagnosis not present

## 2016-10-11 HISTORY — DX: Type 2 diabetes mellitus without complications: E11.9

## 2016-10-11 LAB — CBC WITH DIFFERENTIAL/PLATELET
Basophils Absolute: 0 10*3/uL (ref 0–0.1)
Basophils Relative: 1 %
Eosinophils Absolute: 0.4 10*3/uL (ref 0–0.7)
Eosinophils Relative: 4 %
HEMATOCRIT: 44 % (ref 40.0–52.0)
HEMOGLOBIN: 14.8 g/dL (ref 13.0–18.0)
LYMPHS ABS: 1.7 10*3/uL (ref 1.0–3.6)
LYMPHS PCT: 16 %
MCH: 28.1 pg (ref 26.0–34.0)
MCHC: 33.6 g/dL (ref 32.0–36.0)
MCV: 83.6 fL (ref 80.0–100.0)
MONOS PCT: 5 %
Monocytes Absolute: 0.5 10*3/uL (ref 0.2–1.0)
NEUTROS ABS: 7.5 10*3/uL — AB (ref 1.4–6.5)
Neutrophils Relative %: 74 %
Platelets: 240 10*3/uL (ref 150–440)
RBC: 5.27 MIL/uL (ref 4.40–5.90)
RDW: 15 % — ABNORMAL HIGH (ref 11.5–14.5)
WBC: 10.1 10*3/uL (ref 3.8–10.6)

## 2016-10-11 LAB — BASIC METABOLIC PANEL
ANION GAP: 8 (ref 5–15)
BUN: 14 mg/dL (ref 6–20)
CHLORIDE: 103 mmol/L (ref 101–111)
CO2: 29 mmol/L (ref 22–32)
Calcium: 9.1 mg/dL (ref 8.9–10.3)
Creatinine, Ser: 0.59 mg/dL — ABNORMAL LOW (ref 0.61–1.24)
GFR calc non Af Amer: >60 mL/min (ref >60)
Glucose, Bld: 150 mg/dL — ABNORMAL HIGH (ref 65–99)
Potassium: 3.8 mmol/L (ref 3.5–5.1)
Sodium: 140 mmol/L (ref 135–145)

## 2016-10-11 LAB — MRSA PCR SCREENING: MRSA by PCR: NEGATIVE

## 2016-10-11 LAB — TROPONIN I: Troponin I: <0.03 ng/mL (ref <0.03)

## 2016-10-11 MED ORDER — HEPARIN SODIUM (PORCINE) 5000 UNIT/ML IJ SOLN
5000.0000 [IU] | Freq: Three times a day (TID) | INTRAMUSCULAR | Status: DC
  Administered 2016-10-11 – 2016-10-14 (×8): 5000 [IU] via SUBCUTANEOUS
  Filled 2016-10-11 (×8): qty 1

## 2016-10-11 MED ORDER — ASPIRIN EC 81 MG PO TBEC
81.0000 mg | DELAYED_RELEASE_TABLET | Freq: Every day | ORAL | Status: DC
  Administered 2016-10-12 – 2016-10-14 (×3): 81 mg via ORAL
  Filled 2016-10-11 (×3): qty 1

## 2016-10-11 MED ORDER — LATANOPROST 0.005 % OP SOLN
1.0000 [drp] | Freq: Every day | OPHTHALMIC | Status: DC
  Administered 2016-10-12 – 2016-10-13 (×2): 1 [drp] via OPHTHALMIC
  Filled 2016-10-11: qty 2.5

## 2016-10-11 MED ORDER — TIOTROPIUM BROMIDE MONOHYDRATE 18 MCG IN CAPS
18.0000 ug | ORAL_CAPSULE | Freq: Every day | RESPIRATORY_TRACT | Status: DC
  Administered 2016-10-13: 08:00:00 18 ug via RESPIRATORY_TRACT
  Filled 2016-10-11: qty 5

## 2016-10-11 MED ORDER — LORATADINE 10 MG PO TABS
10.0000 mg | ORAL_TABLET | Freq: Every day | ORAL | Status: DC | PRN
  Filled 2016-10-11: qty 1

## 2016-10-11 MED ORDER — VITAMIN D (ERGOCALCIFEROL) 1.25 MG (50000 UNIT) PO CAPS: 50000.0000 [IU] | ORAL_CAPSULE | ORAL | Status: DC

## 2016-10-11 MED ORDER — MOMETASONE FURO-FORMOTEROL FUM 200-5 MCG/ACT IN AERO
2.0000 | INHALATION_SPRAY | Freq: Two times a day (BID) | RESPIRATORY_TRACT | Status: DC
  Administered 2016-10-13 (×2): 2 via RESPIRATORY_TRACT
  Filled 2016-10-11: qty 8.8

## 2016-10-11 MED ORDER — AMPICILLIN-SULBACTAM SODIUM 3 (2-1) G IJ SOLR
3.0000 g | Freq: Four times a day (QID) | INTRAMUSCULAR | Status: DC
  Administered 2016-10-11 – 2016-10-13 (×7): 3 g via INTRAVENOUS
  Filled 2016-10-11 (×10): qty 3

## 2016-10-11 MED ORDER — INSULIN ASPART 100 UNIT/ML ~~LOC~~ SOLN
0.0000 [IU] | Freq: Three times a day (TID) | SUBCUTANEOUS | Status: DC
  Administered 2016-10-13 – 2016-10-14 (×4): 1 [IU] via SUBCUTANEOUS
  Filled 2016-10-11 (×4): qty 1

## 2016-10-11 MED ORDER — IPRATROPIUM-ALBUTEROL 0.5-2.5 (3) MG/3ML IN SOLN
3.0000 mL | RESPIRATORY_TRACT | Status: DC
  Administered 2016-10-12: 3 mL via RESPIRATORY_TRACT
  Filled 2016-10-11: qty 3

## 2016-10-11 MED ORDER — DOCUSATE SODIUM 100 MG PO CAPS: 100.0000 mg | ORAL_CAPSULE | Freq: Two times a day (BID) | ORAL | Status: DC | PRN

## 2016-10-11 NOTE — Progress Notes (Signed)
Pharmacy Antibiotic Note  Aaron [name] is a 76 y.o. male admitted on 10/11/2016 with pneumonia.  Pharmacy has been consulted for Unasyn dosing.  Plan: Unasyn 3g IV Q6h   Height: 5\' 9"  (175.3 cm) Weight: 130 lb (59 kg) IBW/kg (Calculated) : 70.7  Temp (24hrs), Avg:97.4 F (36.3 C), Min:97.4 F (36.3 C), Max:97.4 F (36.3 C)   Recent Labs Lab 10/11/16 1519  WBC 10.1  CREATININE 0.59*    Estimated Creatinine Clearance: 66.6 mL/min (A) (by C-G formula based on SCr of 0.59 mg/dL (L)).    No Known Allergies  Antimicrobials this admission: Unasyn 3g IV Q6hr    Thank you for allowing pharmacy to be a part of this patient's care.  Delsa BernKelly m Ahniya Mitchum, PharmD 10/11/2016 7:13 PM

## 2016-10-11 NOTE — H&P (Signed)
[facility] [place] at [facility]   PATIENT NAME: Aaron [name]    MR#:  [id number]  DATE OF BIRTH:  Jan 11, 1941  DATE OF ADMISSION:  10/11/2016  PRIMARY CARE PHYSICIAN: Patient, No Pcp Per   REQUESTING/REFERRING PHYSICIAN: Lord  CHIEF COMPLAINT:   Chief Complaint  Patient presents with  . Choking    HISTORY OF PRESENT ILLNESS: Aaron [name]  is a 76 y.o. male with a known history of COPD, dementia, DM, Hyperlipidemia, [name], and was turning bluish with that and coughing, [name], [name] He is full code as per the MOST form from NH, I called daughter, left a voice message, could not talk to her. Pt is demented.  PAST MEDICAL HISTORY:   Past Medical History:  Diagnosis Date  . COPD (chronic obstructive pulmonary disease) (HCC)   . Dementia   . Diabetes mellitus without complication (HCC)   . Hyperlipidemia   . Psoriasis     PAST SURGICAL HISTORY: Past Surgical History:  Procedure Laterality Date  . ESOPHAGOGASTRODUODENOSCOPY N/A 12/31/2015   Procedure: ESOPHAGOGASTRODUODENOSCOPY (EGD);  Surgeon: Sherrilyn Rist, MD;  Location: [facility] ENDOSCOPY;  Service: Gastroenterology;  Laterality: N/A;    SOCIAL HISTORY:  Social History  Substance Use Topics  . Smoking status: Former Games developer  . Smokeless tobacco: Never Used  . Alcohol use No    FAMILY HISTORY:  Family History  Problem Relation Age of Onset  . Hypertension Mother     DRUG ALLERGIES: No Known Allergies  REVIEW OF SYSTEMS:   Pt is demented, could not give more history.  MEDICATIONS AT HOME:  Prior to Admission medications   Medication Sig Start Date End Date Taking? Authorizing Provider  albuterol (PROAIR HFA) 108 (90 Base) MCG/ACT inhaler Inhale 2 puffs into the lungs every 4 (four) hours as needed for wheezing or shortness of breath.   Yes [provider]  aspirin EC 81 MG tablet Take  81 mg by mouth daily.   Yes [provider]  clobetasol cream (TEMOVATE) 0.05 % Apply 1 application topically every 12 (twelve) hours as needed (rash on back).   Yes [provider]  coal tar (DHS TAR) 0.5 % shampoo Apply 1 application topically See admin instructions. Apply to hair topically every Sunday and Wednesday evening shift   Yes [provider]  Fluticasone-Salmeterol (ADVAIR) 250-50 MCG/DOSE AEPB Inhale 1 puff into the lungs 2 (two) times daily.   Yes [provider]  latanoprost (XALATAN) 0.005 % ophthalmic solution Place 1 drop into both eyes at bedtime.   Yes [provider]  loratadine (CLARITIN) 10 MG tablet Take 10 mg by mouth daily as needed for allergies.   Yes [provider]  tiotropium (SPIRIVA) 18 MCG inhalation capsule Place 18 mcg into inhaler and inhale daily.   Yes [provider]  Vitamin D, Ergocalciferol, (DRISDOL) 50000 units CAPS capsule Take 50,000 Units by mouth every 30 (thirty) days. On the 30th of each month   Yes [provider]      PHYSICAL EXAMINATION:   VITAL SIGNS: Blood pressure (!) 163/91, pulse 96, temperature 97.4 F (36.3 C), temperature source Axillary, resp. rate (!) 23, height 5\' 9"  (1.753 m), weight 59 kg (130 lb), SpO2 90 %.  GENERAL:  76 y.o.-year-old patient lying in the bed with acute distress.  EYES: Pupils equal, round, reactive to light and accommodation. No scleral  icterus. Extraocular muscles intact.  HEENT: Head atraumatic, normocephalic. Oropharynx and nasopharynx clear.  NECK:  Supple, no jugular venous distention. No thyroid enlargement, no tenderness.  LUNGS: Normal breath sounds bilaterally, some wheezing, some crepitation. Positive for use of accessory muscles of respiration. On 6 ltr oxygen via mask. CARDIOVASCULAR: S1, S2 normal. No murmurs, rubs, or gallops.  ABDOMEN: Soft, nontender, nondistended. Bowel sounds present. No organomegaly or mass.   EXTREMITIES: No pedal edema, cyanosis, or clubbing.  NEUROLOGIC: pt have dementia, and some lethargic, opens eyes to call, and speak few words, moves limbs slowly. PSYCHIATRIC: The patient is arousable, demented.  SKIN: No obvious rash, lesion, or ulcer.   LABORATORY PANEL:   CBC  Recent Labs Lab 10/11/16 1519  WBC 10.1  HGB 14.8  HCT 44.0  PLT 240  MCV 83.6  MCH 28.1  MCHC 33.6  RDW 15.0*  LYMPHSABS 1.7  MONOABS 0.5  EOSABS 0.4  BASOSABS 0.0   ------------------------------------------------------------------------------------------------------------------  Chemistries   Recent Labs Lab 10/11/16 1519  NA 140  K 3.8  CL 103  CO2 29  GLUCOSE 150*  BUN 14  CREATININE 0.59*  CALCIUM 9.1   ------------------------------------------------------------------------------------------------------------------ estimated creatinine clearance is 66.6 mL/min (A) (by C-G formula based on SCr of 0.59 mg/dL (L)). ------------------------------------------------------------------------------------------------------------------ No results for input(s): TSH, T4TOTAL, T3FREE, THYROIDAB in the last 72 hours.  Invalid input(s): FREET3   Coagulation profile No results for input(s): INR, PROTIME in the last 168 hours. ------------------------------------------------------------------------------------------------------------------- No results for input(s): DDIMER in the last 72 hours. -------------------------------------------------------------------------------------------------------------------  Cardiac Enzymes  Recent Labs Lab 10/11/16 1519  TROPONINI <0.03   ------------------------------------------------------------------------------------------------------------------ Invalid input(s): POCBNP  ---------------------------------------------------------------------------------------------------------------  Urinalysis No results found for: COLORURINE, APPEARANCEUR,  LABSPEC, PHURINE, GLUCOSEU, HGBUR, BILIRUBINUR, KETONESUR, PROTEINUR, UROBILINOGEN, NITRITE, LEUKOCYTESUR   RADIOLOGY: Dg Chest Port 1 View  Result Date: 10/11/2016 CLINICAL DATA:  Choking episode with hypoxia EXAM: PORTABLE CHEST 1 VIEW COMPARISON:  01/02/2016 FINDINGS: Elevation of the left diaphragm as before. Mild opacity at the left base. No pleural effusion. Right lung clear. Stable cardiomediastinal silhouette with atherosclerosis. No pneumothorax. IMPRESSION: Minimal opacity at the left lung base could reflect atelectasis or small amount of aspiration. Electronically Signed   By: Jasmine Pang M.D.   On: 10/11/2016 14:46    EKG: Orders placed or performed during the hospital encounter of 10/11/16  . ED EKG  . ED EKG  . EKG 12-Lead  . EKG 12-Lead  . EKG 12-Lead  . EKG 12-Lead  . EKG 12-Lead  . EKG 12-Lead    IMPRESSION AND PLAN:  * Ac hypoxia respi failure   Aspiration, high risk to develop pneumonia   Oxygen.   Duoneb   IV unasyn    Repeat Xray tomorrow.    Full code. Will speak to daughter, called but not successful so far.    Get SLP eval.  * dementia- baseline.  * COPD   Cont duoneb  * Glaucoma   Cont eye drops.  All the records are reviewed and case discussed with ED provider. Management plans discussed with the patient, family and they are in agreement.  CODE STATUS: Full. Code Status History    Date Active Date Inactive Code Status Order ID Comments User Context   12/31/2015  8:41 PM 01/03/2016  9:08 PM Full Code [id number]  Kathlene Cote, PA-C Inpatient    Advance Directive Documentation     Most Recent Value  Type of Advance Directive  Out of facility DNR (pink MOST or yellow form)  Pre-existing out of facility DNR order (yellow form or pink MOST form)  Pink MOST form placed in chart (order not valid for inpatient use)  "MOST" Form in Place?  -       TOTAL TIME TAKING CARE OF THIS PATIENT: 45 minutes.    Altamese DillingVACHHANI, Lanie Schelling M.D on 10/11/2016    Between 7am to 6pm - Pager - [phone]  After 6pm go to [email or web address] - password [email or web address]  [facility] [place] [facility]  Office  [phone]  CC: Primary care physician; Patient, No Pcp Per   Note: This dictation was prepared with Dragon dictation along with smaller phrase technology. Any transcriptional errors that result from this process are unintentional.

## 2016-10-11 NOTE — ED Notes (Signed)
Pt's daughter left to get dinner; will be back soon.

## 2016-10-11 NOTE — ED Provider Notes (Signed)
[facility] Emergency Department Provider Note ____________________________________________   I have reviewed the triage vital signs and the triage nursing note.  HISTORY  Chief Complaint Choking   Historian Limited historian, however 5 caveat due to dementia  HPI Aaron [name] is a 76 y.o. male patient brought in by EMS from nursing home where he was witnessed to choke on food. Apparently he turned blue and chest compressions were performed. When EMS got there patient had low O2 sats but food apparently had been dislodged and patient was breathing on his own although gurgling like there may be some aspiration. Patient has dementia, unclear if he is related as complete baseline. No report of home O2 use.  Symptoms moderate.  Brings MOST form with him that indicates full code.    Past Medical History:  Diagnosis Date  . COPD (chronic obstructive pulmonary disease) (HCC)   . Dementia   . Hyperlipidemia   . Psoriasis     Patient Active Problem List   Diagnosis Date Noted  . Acute hypoxemic respiratory failure (HCC) 01/01/2016  . Acute respiratory failure with hypoxia (HCC)   . Food impaction of esophagus   . Hyperlipidemia   . Acute encephalopathy   . Aspiration pneumonia of right lower lobe [facility](HCC)     Past Surgical History:  Procedure Laterality Date  . ESOPHAGOGASTRODUODENOSCOPY N/A 12/31/2015   Procedure: ESOPHAGOGASTRODUODENOSCOPY (EGD);  Surgeon: Sherrilyn RistHenry L Danis III, MD;  [facility];  Service: Gastroenterology;  Laterality: N/A;    Prior to Admission medications   Medication Sig Start Date End Date Taking? Authorizing Provider  albuterol (PROAIR HFA) 108 (90 Base) MCG/ACT inhaler Inhale 2 puffs into the lungs every 4 (four) hours as needed for wheezing or shortness of breath.   Yes [provider]  aspirin EC 81 MG tablet Take 81 mg by mouth daily.   Yes [provider]  clobetasol cream (TEMOVATE) 0.05 % Apply 1  application topically every 12 (twelve) hours as needed (rash on back).   Yes [provider]  coal tar (DHS TAR) 0.5 % shampoo Apply 1 application topically See admin instructions. Apply to hair topically every Sunday and Wednesday evening shift   Yes [provider]  Fluticasone-Salmeterol (ADVAIR) 250-50 MCG/DOSE AEPB Inhale 1 puff into the lungs 2 (two) times daily.   Yes [provider]  latanoprost (XALATAN) 0.005 % ophthalmic solution Place 1 drop into both eyes at bedtime.   Yes [provider]  loratadine (CLARITIN) 10 MG tablet Take 10 mg by mouth daily as needed for allergies.   Yes [provider]  tiotropium (SPIRIVA) 18 MCG inhalation capsule Place 18 mcg into inhaler and inhale daily.   Yes [provider]  Vitamin D, Ergocalciferol, (DRISDOL) 50000 units CAPS capsule Take 50,000 Units by mouth every 30 (thirty) days. On the 30th of each month   Yes [provider]    No Known Allergies  History reviewed. No pertinent family history.  Social History Social History  Substance Use Topics  . Smoking status: Former Games developermoker  . Smokeless tobacco: Never Used  . Alcohol use No    Review of Systems  Low 5 caveat unable to obtain due to altered mental status/dementia  ____________________________________________   PHYSICAL EXAM:  VITAL SIGNS: ED Triage Vitals  Enc Vitals Group     BP      Pulse      Resp      Temp  Temp src      SpO2      Weight      Height      Head Circumference      Peak Flow      Pain Score      Pain Loc      Pain Edu?      Excl. in GC?      Constitutional: Alert and follows some commands, but not speaking.  HEENT   Head: Normocephalic and atraumatic.      Eyes: Conjunctivae are normal. Pupils equal and round.       Ears:         Nose: No congestion/rhinnorhea.   Mouth/Throat: Mucous membranes are moist.   Neck: No stridor. Cardiovascular/Chest: Normal rate,  regular rhythm.  No murmurs, rubs, or gallops. Respiratory: Tachypnea. No flail chest. Severe bronchitis/gurgling bilaterally. Gastrointestinal: Soft. No distention, no guarding, no rebound. Nontender.    Genitourinary/rectal:Deferred Musculoskeletal: No lower extremity edema.  Neurologic: No facial droop. Following some commands but not speaking. Moving 4 extremities purposefully. Skin:  Skin is warm, dry and intact. No rash noted.   ____________________________________________  LABS (pertinent positives/negatives)  Labs Reviewed  TROPONIN I  BASIC METABOLIC PANEL  CBC WITH DIFFERENTIAL/PLATELET    ____________________________________________    EKG I, Governor Rooks, MD, the attending physician have personally viewed and interpreted all ECGs.  96 bpm normal sinus rhythm. Narrow QRS normal axis. Nonspecific ST and T-wave ____________________________________________  RADIOLOGY All Xrays were viewed by me. Imaging interpreted by Radiologist.  Chest x-ray portable:  IMPRESSION: Minimal opacity at the left lung base could reflect atelectasis or small amount of aspiration. __________________________________________  PROCEDURES  Procedure(s) performed: None  Critical Care performed: None  ____________________________________________   ED COURSE / ASSESSMENT AND PLAN  Pertinent labs & imaging results that were available during my care of the patient were reviewed by me and considered in my medical decision making (see chart for details).    Initially to patient off of nonrebreather to find out if he is actually hypoxic. He does continue to be hypoxic and dropped fairly quickly to the mid 80s was placed back on nonrebreather. On the nonrebreather he was around 90%. He is having clinical symptoms concerning for aspiration.  Chest x-ray does show signs of likely aspiration, and certainly clinically he is consistent with aspiration.  I discussed with Dr. Desiree Hane,  hospitalist for admission. Awaiting labs at this point in time. ED care transferred to Dr. Alphonzo Lemmings at shift change 3:15pm.  Will be transferred to the hospitalist upon laboratory evaluation complete.  Hospitalist is in agreement no antibiotics at this point, likely more pneumonitis.    CONSULTATIONS:   Hospitalist for admission.   Patient / Family / Caregiver informed of clinical course, medical decision-making process, and agree with plan.  ___________________________________________   FINAL CLINICAL IMPRESSION(S) / ED DIAGNOSES   Final diagnoses:  Choking episode  Aspiration pneumonia of left lower lobe due to vomit [facility])              Note: This dictation was prepared with Dragon dictation. Any transcriptional errors that result from this process are unintentional    Governor Rooks, MD 10/11/16 [phone]

## 2016-10-11 NOTE — ED Notes (Signed)
Pt resting comfortably; family updated on status. Pt waiting for bed assignment.

## 2016-10-11 NOTE — Progress Notes (Signed)
I spoke to his daughter in room, he had aspiration in Aug 2017- was on ventilator. He is on soft and chopped food now.  I informed her about the critical condition and discussed about dementia, aspiration, COPD,, possible pneumonia. Daughter is HCPOA. Pt can not participate due to dementia.  He may have frequent complications due to this. She want him to be FUll code.  Time spent 20 min in this discussion.

## 2016-10-11 NOTE — ED Triage Notes (Signed)
Pt via ems from [facility] after choking on a brownie. EMS reports that pt was blue and cpr was being performed upon their arrival. Apparently, food was freed and pt was placed on O2 due to sats arouind 80%. Pt was on NRB at 10l upon arrival. Pt has hx of dementia and is on liquid or ground diet. NAD noted.

## 2016-10-12 ENCOUNTER — Inpatient Hospital Stay: Payer: Medicare (Managed Care)

## 2016-10-12 LAB — BASIC METABOLIC PANEL WITH GFR
Anion gap: 12 (ref 5–15)
BUN: 17 mg/dL (ref 6–20)
CO2: 28 mmol/L (ref 22–32)
Calcium: 9.1 mg/dL (ref 8.9–10.3)
Chloride: 103 mmol/L (ref 101–111)
Creatinine, Ser: 0.84 mg/dL (ref 0.61–1.24)
GFR calc Af Amer: >60 mL/min
GFR calc non Af Amer: >60 mL/min
Glucose, Bld: 147 mg/dL — ABNORMAL HIGH (ref 65–99)
Potassium: 3.2 mmol/L — ABNORMAL LOW (ref 3.5–5.1)
Sodium: 143 mmol/L (ref 135–145)

## 2016-10-12 LAB — GLUCOSE, CAPILLARY
GLUCOSE-CAPILLARY: 120 mg/dL — AB (ref 65–99)
GLUCOSE-CAPILLARY: 124 mg/dL — AB (ref 65–99)
Glucose-Capillary: 128 mg/dL — ABNORMAL HIGH (ref 65–99)
Glucose-Capillary: 144 mg/dL — ABNORMAL HIGH (ref 65–99)

## 2016-10-12 LAB — CBC
HCT: 45.5 % (ref 40.0–52.0)
Hemoglobin: 15.6 g/dL (ref 13.0–18.0)
MCH: 28.5 pg (ref 26.0–34.0)
MCHC: 34.3 g/dL (ref 32.0–36.0)
MCV: 82.9 fL (ref 80.0–100.0)
Platelets: 234 K/uL (ref 150–440)
RBC: 5.49 MIL/uL (ref 4.40–5.90)
RDW: 14.9 % — ABNORMAL HIGH (ref 11.5–14.5)
WBC: 8.3 K/uL (ref 3.8–10.6)

## 2016-10-12 MED ORDER — POTASSIUM CHLORIDE IN NACL 20-0.9 MEQ/L-% IV SOLN
INTRAVENOUS | Status: DC
  Administered 2016-10-12 (×2): via INTRAVENOUS
  Filled 2016-10-12 (×3): qty 1000

## 2016-10-12 MED ORDER — SODIUM CHLORIDE 0.9 % IV BOLUS (SEPSIS)
250.0000 mL | Freq: Once | INTRAVENOUS | Status: AC
  Administered 2016-10-12: 09:00:00 250 mL via INTRAVENOUS

## 2016-10-12 MED ORDER — IPRATROPIUM-ALBUTEROL 0.5-2.5 (3) MG/3ML IN SOLN
3.0000 mL | Freq: Four times a day (QID) | RESPIRATORY_TRACT | Status: DC
  Administered 2016-10-12 (×3): 3 mL via RESPIRATORY_TRACT
  Filled 2016-10-12 (×2): qty 3

## 2016-10-12 NOTE — Progress Notes (Addendum)
[facility] - [place] at [facility]   PATIENT NAME: Aaron [name]    MR#:  [id number]  DATE OF BIRTH:  1940-08-04  SUBJECTIVE:   Came in with respiratory distress. Found to have pneumonia Unable to give hisotry REVIEW OF SYSTEMS:   Review of Systems  Unable to perform ROS: Dementia   Tolerating Diet:npo Tolerating PT: pending  DRUG ALLERGIES:  No Known Allergies  VITALS:  Blood pressure 115/62, pulse 99, temperature 97.6 F (36.4 C), temperature source Oral, resp. rate 20, height 5\' 9"  (1.753 m), weight 53.9 kg (118 lb 14.4 oz), SpO2 98 %.  PHYSICAL EXAMINATION:   Physical Exam  GENERAL:  76 y.o.-year-old patient lying in the bed with no acute distress.  EYES: Pupils equal, round, reactive to light and accommodation. No scleral icterus. Extraocular muscles intact.  HEENT: Head atraumatic, normocephalic. Oropharynx and nasopharynx clear.  NECK:  Supple, no jugular venous distention. No thyroid enlargement, no tenderness.  LUNGS: decreased breath sounds bilaterally, no wheezing, rales, rhonchi. No use of accessory muscles of respiration.  CARDIOVASCULAR: S1, S2 normal. No murmurs, rubs, or gallops.  ABDOMEN: Soft, nontender, nondistended. Bowel sounds present. No organomegaly or mass.  EXTREMITIES: No cyanosis, clubbing or edema b/l.    NEUROLOGIC: grossly non focal PSYCHIATRIC:  patient is alert but confused due to dementia  SKIN: No obvious rash, lesion, or ulcer.   LABORATORY PANEL:  CBC  Recent Labs Lab 10/12/16 0415  WBC 8.3  HGB 15.6  HCT 45.5  PLT 234    Chemistries   Recent Labs Lab 10/12/16 0415  NA 143  K 3.2*  CL 103  CO2 28  GLUCOSE 147*  BUN 17  CREATININE 0.84  CALCIUM 9.1   Cardiac Enzymes  Recent Labs Lab 10/11/16 1519  TROPONINI <0.03   RADIOLOGY:  Dg Chest 2 View  Result Date: 10/12/2016 CLINICAL DATA:  Choking episode, cardiac arrest, possible aspiration. EXAM: CHEST  2 VIEW COMPARISON:  10/11/2016  and CT chest 12/31/2015. FINDINGS: Patient is rotated. Trachea is midline. Heart size within normal limits. Airspace opacification in the right lower lobe. Rounded airspace opacification in the left lower lobe. Biapical pleuroparenchymal scarring. Stomach is distended with air. Levoconvex scoliosis and degenerative changes in the spine. IMPRESSION: Bilateral lower lobe airspace opacification, right greater than left, possibly due to aspiration. Electronically Signed   By: Leanna BattlesMelinda  Blietz M.D.   On: 10/12/2016 08:00   Dg Chest Port 1 View  Result Date: 10/11/2016 CLINICAL DATA:  Choking episode with hypoxia EXAM: PORTABLE CHEST 1 VIEW COMPARISON:  01/02/2016 FINDINGS: Elevation of the left diaphragm as before. Mild opacity at the left base. No pleural effusion. Right lung clear. Stable cardiomediastinal silhouette with atherosclerosis. No pneumothorax. IMPRESSION: Minimal opacity at the left lung base could reflect atelectasis or small amount of aspiration. Electronically Signed   By: Jasmine PangKim  Fujinaga M.D.   On: 10/11/2016 14:46   ASSESSMENT AND PLAN:  Aaron [name]  is a 76 y.o. male with a known history of COPD, dementia, DM, Hyperlipidemia, [name], and was turning bluish with that and coughing, [name], [name]  Ac hypoxia respiratory  Failure due to bilateral pneumonia right> left and likely aspirated (has done this before) - Oxygen.  - prn Duoneb -  IV unasyn -   Full code.per dter -  Get SLP eval.  * dementia- baseline.  * COPD   Cont duoneb  *  Glaucoma   Cont eye drops.  CSW for d/c  Case discussed with Care Management/Social Worker. Management plans discussed with the patient, family and they are in agreement.  CODE STATUS: FULL  DVT Prophylaxis: heparin  TOTAL TIME TAKING CARE OF THIS PATIENT: *30* minutes.  >50% time spent on counselling and coordination of care  POSSIBLE D/C IN  *1-2* DAYS, DEPENDING ON CLINICAL CONDITION.  Note: This dictation was prepared with Dragon dictation along with smaller phrase technology. Any transcriptional errors that result from this process are unintentional.  Thanvi Blincoe M.D on 10/12/2016 at 8:06 AM  Between 7am to 6pm - Pager - [phone]  After 6pm go to [email or web address] - password [email or web address]  [facility] [place] [facility]  Office  [phone]  CC: Primary care physician; Patient, No Pcp Per

## 2016-10-12 NOTE — Plan of Care (Signed)
Problem: Safety: Goal: Ability to remain free from injury will improve Outcome: Progressing Pt moved to a low bed and safety mats placed on floor.  Pt slept for most of the night.

## 2016-10-12 NOTE — Progress Notes (Signed)
Pt came to unit with non re-breather mask.  Unable to maintain O2 stat above 90 at 10 L.  Increased to 15 L.  Pt breathing improved, unlabored and decreased oxygen to 4.5 L [place] with saturation at 100%.  Continued to wean pt to 3 L [place] with saturation of 98%.  Pt sleeping quietly. Henriette CombsSarah Lakela Kuba RN

## 2016-10-12 NOTE — Evaluation (Signed)
Clinical/Bedside Swallow Evaluation Patient Details  Name: Aaron [name] MRN: [id number] Date of Birth: [birth year]  Today's Date: 10/12/2016 Time: SLP Start Time (ACUTE ONLY): 0815 SLP Stop Time (ACUTE ONLY): 0915 SLP Time Calculation (min) (ACUTE ONLY): 60 min  Past Medical History:  Past Medical History:  Diagnosis Date  . COPD (chronic obstructive pulmonary disease) (HCC)   . Dementia   . Diabetes mellitus without complication (HCC)   . Hyperlipidemia   . Psoriasis    Past Surgical History:  Past Surgical History:  Procedure Laterality Date  . ESOPHAGOGASTRODUODENOSCOPY N/A 12/31/2015   Procedure: ESOPHAGOGASTRODUODENOSCOPY (EGD);  Surgeon: Sherrilyn Rist, MD;  Location: [facility] ENDOSCOPY;  Service: Gastroenterology;  Laterality: N/A;   HPI:  Pt  is a 76 y.o. male with a known history of COPD, dementia, DM, Hyperlipidemia, psoriasis who lives in [place]. He had aspiration today, and was turning bluish with that and coughing, [name], no fever. Due to pt's gurgling and suspected aspiration, pt was admitted. Unable to give any history. Pt was minimal in his communication and interactions w/ SLP; follow through was reduced. Moderate verbal/tactile cues given. Pt appears thin, cachectic.    Assessment / Plan / Recommendation Clinical Impression  Pt appears at increased risk for aspiration secondary to declined Cognitive status (Dementia baseline) and oral phase dysphagia; pt is alos edentulous which can impact bolus mastication and control for swallowing. Pt consumed trials of Nectar consistency liquids, and ice chips, w/ min reduced oral phase awareness and bolus management which can increase risk for pharyngeal phase dysphagia; no coughing or other overt s/s of aspiration were noted w/ the trials of Nectar consistency liquids and purees. The puree consistency allowed pt for better bolus management w/ no need for mastication/gumming. This diet consistency would best serve pt  during this time of acute illness and suspected increased risk for oropharyngeal phase dysphagia. Recommend Pills given in puree - crushed as needed for safer swallowing; feeding support and monitoring at all meals. Dietician f/u.  SLP Visit Diagnosis: Dysphagia, oropharyngeal phase (R13.12)    Aspiration Risk  Mild aspiration risk    Diet Recommendation  Dysphagia level 1(puree) w/ Nectar consistency liquids; strict aspiration precautions; feeding support and monitoring at all meals  Medication Administration: Crushed with puree (whole if able to clear appropriately, safely)    Other  Recommendations Recommended Consults:  (Dietician consult) Oral Care Recommendations: Oral care BID;Staff/trained caregiver to provide oral care Other Recommendations: Order thickener from pharmacy;Prohibited food (jello, ice cream, thin soups);Remove water pitcher;Have oral suction available   Follow up Recommendations Skilled Nursing facility      Frequency and Duration min 3x week  2 weeks       Prognosis Prognosis for Safe Diet Advancement: Fair Barriers to Reach Goals: Cognitive deficits;Severity of deficits      Swallow Study   General Date of Onset: 10/11/16 HPI: Pt  is a 76 y.o. male with a known history of COPD, dementia, DM, Hyperlipidemia, psoriasis who lives in [place]. He had aspiration today, and was turning bluish with that and coughing, [name], no fever. Due to pt's gurgling and suspected aspiration, pt was admitted. Unable to give any history. Pt was minimal in his communication and interactions w/ SLP; follow through was reduced. Moderate verbal/tactile cues given. Pt appears thin, cachectic.  Type of Study: Bedside Swallow Evaluation Previous Swallow Assessment: none reported Diet Prior to this Study:  (unknown - eating brownies at [facility])  Temperature Spikes Noted: No (wbc 8.3) Respiratory Status: Nasal cannula (3 liters) History of Recent Intubation:  No Behavior/Cognition: Alert;Cooperative;Pleasant mood;Confused;Distractible;Requires cueing;Doesn't follow directions Oral Cavity Assessment: Dry (min) Oral Care Completed by SLP: Recent completion by staff Oral Cavity - Dentition: Edentulous Vision:  (n/a) Self-Feeding Abilities: Total assist (did not make attempts to feed self) Patient Positioning: Upright in bed (slightly on right side w/ legs drawn up) Baseline Vocal Quality:  (mumbled x2-3) Volitional Cough: Cognitively unable to elicit Volitional Swallow: Unable to elicit    Oral/Motor/Sensory Function Overall Oral Motor/Sensory Function:  (fair w/ bolus control/manipulation; did not follow)   Ice Chips Ice chips: Impaired Presentation: Spoon (x2 trials; fed) Oral Phase Impairments: Reduced labial seal;Poor awareness of bolus Oral Phase Functional Implications: Prolonged oral transit Other Comments: no coughing noted   Thin Liquid Thin Liquid: Not tested    Nectar Thick Nectar Thick Liquid: Impaired Presentation: Spoon (fed; ~2 ozs total) Oral Phase Impairments: Reduced labial seal;Poor awareness of bolus Oral phase functional implications: Prolonged oral transit Other Comments: no coughing   Honey Thick Honey Thick Liquid: Not tested   Puree Puree: Within functional limits Presentation: Spoon (fed; 8 trials) Other Comments: fair A-P transfer   Solid   GO   Solid: Not tested Other Comments: edentulous          Jerilynn SomKatherine Watson, MS, CCC-SLP Watson,Katherine 10/12/2016,11:00 AM

## 2016-10-12 NOTE — NC FL2 (Signed)
[place] MEDICAID FL2 LEVEL OF CARE SCREENING TOOL     IDENTIFICATION  Patient Name: Aaron [name] Birthdate: 02/24/1941 Sex: male Admission Date (Current Location): 10/11/2016  [facility] [place]   Facility and Address:  [facility], [address], [place], [place] [address]      Provider Number: [id number]  Attending Physician Name and Address:  Enedina FinnerPatel, Sona, MD  Relative Name and Phone Number:       Current Level of Care: Hospital Recommended Level of Care: Memory Care Unit Prior Approval Number:    Date Approved/Denied:   PASRR Number: [id number]  Discharge Plan: Domiciliary (Rest home)    Current Diagnoses: Patient Active Problem List   Diagnosis Date Noted  . Aspiration into airway 10/11/2016  . Acute respiratory failure (HCC) 10/11/2016  . Acute hypoxemic respiratory failure (HCC) 01/01/2016  . Acute respiratory failure with hypoxia (HCC)   . Food impaction of esophagus   . Hyperlipidemia   . Acute encephalopathy   . Aspiration pneumonia of right lower lobe (HCC)     Orientation RESPIRATION BLADDER Height & Weight     Self  Normal Continent Weight: 118 lb 14.4 oz (53.9 kg) Height:  5\' 9"  (175.3 cm)  BEHAVIORAL SYMPTOMS/MOOD NEUROLOGICAL BOWEL NUTRITION STATUS      Continent Diet (Dysphagia 1)  AMBULATORY STATUS COMMUNICATION OF NEEDS Skin   Supervision Verbally Normal                       Personal Care Assistance Level of Assistance  Bathing, Feeding, Dressing Bathing Assistance: Limited assistance Feeding assistance: Limited assistance Dressing Assistance: Limited assistance     Functional Limitations Info  Hearing   Hearing Info: Impaired      SPECIAL CARE FACTORS FREQUENCY                       Contractures      Additional Factors Info                  Discharge Medications: Please see discharge summary for a list of discharge medications. Current Discharge  Medication List       START taking these medications   Details  amoxicillin-clavulanate (AUGMENTIN) 875-125 MG tablet Take 1 tablet by mouth every 12 (twelve) hours. Qty: 12 tablet, Refills: 0         CONTINUE these medications which have NOT CHANGED   Details  albuterol (PROAIR HFA) 108 (90 Base) MCG/ACT inhaler Inhale 2 puffs into the lungs every 4 (four) hours as needed for wheezing or shortness of breath.    aspirin EC 81 MG tablet Take 81 mg by mouth daily.    clobetasol cream (TEMOVATE) 0.05 % Apply 1 application topically every 12 (twelve) hours as needed (rash on back).    coal tar (DHS TAR) 0.5 % shampoo Apply 1 application topically See admin instructions. Apply to hair topically every Sunday and Wednesday evening shift    Fluticasone-Salmeterol (ADVAIR) 250-50 MCG/DOSE AEPB Inhale 1 puff into the lungs 2 (two) times daily.    latanoprost (XALATAN) 0.005 % ophthalmic solution Place 1 drop into both eyes at bedtime.    loratadine (CLARITIN) 10 MG tablet Take 10 mg by mouth daily as needed for allergies.    tiotropium (SPIRIVA) 18 MCG inhalation capsule Place 18 mcg into inhaler and inhale daily.    Vitamin D, Ergocalciferol, (DRISDOL) 50000 units CAPS capsule Take 50,000 Units by mouth  every 30 (thirty) days. On the 30th of each month       Relevant Imaging Results:  Relevant Lab Results:   Additional Information SS# [id number]  Judi Cong, LCSW

## 2016-10-12 NOTE — Clinical Social Work Note (Signed)
Clinical Social Work Assessment  Patient Details  Name: Aaron GrillsJohnny P [name] MRN: [id number] Date of Birth: [birth year]  Date of referral:  10/12/16               Reason for consult:  Facility Placement                Permission sought to share information with:  Facility Industrial/product designerContact Representative Permission granted to share information::  Yes, Verbal Permission Granted  Name::        Agency::     Relationship::     Contact Information:     Housing/Transportation Living arrangements for the past 2 months:  Assisted Living Facility Source of Information:  Facility Patient Interpreter Needed:  None Criminal Activity/Legal Involvement Pertinent to Current Situation/Hospitalization:  No - Comment as needed Significant Relationships:  Merchandiser, retailCommunity Support Lives with:  Facility Resident Do you feel safe going back to the place where you live?  Yes Need for family participation in patient care:  Yes (Comment) (The patient has dementia. HCPOA not on file.)  Care giving concerns: [facility]   Social Worker assessment / plan:  CSW attempted to meet with family at bedside. The patient was sleeping soundly and no family was present. The CSW attempted to contact the listed Emergency Contact with no answer and left a voicemail for a return call.  The CSW [facility] for information about the patient. The patient is able to return to the facility at discharge, and he will most likely need to return via EMS due to dementia issues if family cannot be reached. The patient is on a dysphagia 1 diet which has been in place at the facility; however, he aspirated on a brownie. CSW will continue to follow discharge planning.  Employment status:  Retired Health and safety inspectornsurance information:  Armed forces operational officerMedicare, Medicaid [place] PT Recommendations:  Not assessed at this time Information / Referral to community resources:     Patient/Family's Response to care:  The family was not  available.  Patient/Family's Understanding of and Emotional Response to Diagnosis, Current Treatment, and Prognosis:  The patient has dementia and the family is not available.  Emotional Assessment Appearance:  Appears stated age Attitude/Demeanor/Rapport:   (Patient was sleeping.) Affect (typically observed):   (Patient was sleeping.) Orientation:   (Patient was sleeping.) Alcohol / Substance use:  Never Used Psych involvement (Current and /or in the community):  No (Comment)  Discharge Needs  Concerns to be addressed:  Compliance Issues Concerns, Discharge Planning Concerns, Care Coordination Readmission within the last 30 days:  No Current discharge risk:  Cognitively Impaired Barriers to Discharge:  Continued Medical Work up   [employer] CorporationKaren M Charlane Westry, LCSW 10/12/2016, 3:13 PM

## 2016-10-13 LAB — GLUCOSE, CAPILLARY
GLUCOSE-CAPILLARY: 139 mg/dL — AB (ref 65–99)
GLUCOSE-CAPILLARY: 129 mg/dL — AB (ref 65–99)
GLUCOSE-CAPILLARY: 174 mg/dL — AB (ref 65–99)
Glucose-Capillary: 90 mg/dL (ref 65–99)

## 2016-10-13 MED ORDER — IPRATROPIUM-ALBUTEROL 0.5-2.5 (3) MG/3ML IN SOLN
3.0000 mL | Freq: Three times a day (TID) | RESPIRATORY_TRACT | Status: DC
  Administered 2016-10-13: 3 mL via RESPIRATORY_TRACT
  Filled 2016-10-13: qty 3

## 2016-10-13 MED ORDER — IPRATROPIUM-ALBUTEROL 0.5-2.5 (3) MG/3ML IN SOLN: 3.0000 mL | Freq: Four times a day (QID) | RESPIRATORY_TRACT | Status: DC | PRN

## 2016-10-13 MED ORDER — AMOXICILLIN-POT CLAVULANATE 875-125 MG PO TABS
1.0000 | ORAL_TABLET | Freq: Two times a day (BID) | ORAL | Status: DC
  Administered 2016-10-13 – 2016-10-14 (×2): 1 via ORAL
  Filled 2016-10-13 (×2): qty 1

## 2016-10-13 MED ORDER — IPRATROPIUM-ALBUTEROL 0.5-2.5 (3) MG/3ML IN SOLN
3.0000 mL | Freq: Two times a day (BID) | RESPIRATORY_TRACT | Status: DC
Start: 2016-10-13 — End: 2016-10-14
  Administered 2016-10-13 – 2016-10-14 (×2): 3 mL via RESPIRATORY_TRACT
  Filled 2016-10-13 (×2): qty 3

## 2016-10-13 NOTE — Plan of Care (Signed)
Problem: Nutrition: Goal: Adequate nutrition will be maintained Outcome: Progressing Dysphagia I, Nectar Thick liquids ordered; pt fed by staff at meals

## 2016-10-13 NOTE — Plan of Care (Signed)
Problem: Safety: Goal: Ability to remain free from injury will improve Outcome: Progressing Low bed remains in use for pt with bed alarm set

## 2016-10-13 NOTE — Progress Notes (Signed)
Initial Nutrition Assessment  DOCUMENTATION CODES:   Severe malnutrition in context of chronic illness  INTERVENTION:  1. Continue Magic cup TID with meals, each supplement provides 290 kcal and 9 grams of protein 2. On nectar thick liquids - with baseline dementia, will be difficult to maintain hydration status, recommend IV fluids as needed 3. Continue NDD1-nectar thick per SLP - patient has a history of esophageal food impaction.  NUTRITION DIAGNOSIS:   Malnutrition (severe) related to chronic illness (dementia) as evidenced by severe depletion of muscle mass, severe depletion of body fat  GOAL:   Patient will meet greater than or equal to 90% of their needs  MONITOR:   PO intake, I & O's, Labs, Weight trends, Supplement acceptance  REASON FOR ASSESSMENT:   Consult Assessment of nutrition requirement/status  ASSESSMENT:   Aaron [name]  is a 76 y.o. male with a known history of COPD, dementia, DM, Hyperlipidemia, [name], and was turning bluish with that and coughing, [name], no fever. Given for admission due to hypoxia  Patient with dementia at baseline, unable to provide any history. Nutrition-Focused physical exam completed. Findings are severe fat depletion, severe muscle depletion, and no edema.  Ate 100% this morning per RN Per chart exhibits 22#/15.7% severe wt loss over 9 months. Off O2 now. Labs and medications reviewed: CBGs WNL Vitamin D 1610950000 IU, Colace PRN  Diet Order:  DIET - DYS 1 Room service appropriate? Yes with Assist; Fluid consistency: Nectar Thick  Skin:  Reviewed, no issues  Last BM:  10/13/2016  Height:   Ht Readings from Last 1 Encounters:  10/11/16 5\' 9"  (1.753 m)    Weight:   Wt Readings from Last 1 Encounters:  10/11/16 118 lb 14.4 oz (53.9 kg)    Ideal Body Weight:  72.72 kg  BMI:  Body mass index is 17.56 kg/m.  Estimated Nutritional Needs:   Kcal:  1340-1600  calories  Protein:  53-64 gm  Fluid:  >/= 1.3L  EDUCATION NEEDS:   No education needs identified at this time  Dionne AnoWilliam M. Sally Menard, MS, RD LDN Inpatient Clinical Dietitian Pager [phone]

## 2016-10-13 NOTE — Progress Notes (Signed)
[facility]   PATIENT NAME: Aaron [name]    MR#:  [id number]  DATE OF BIRTH:  08-22-1940  SUBJECTIVE:   Came in with respiratory distress. Found to have pneumonia Unable to give hisotry REVIEW OF SYSTEMS:   Review of Systems  Unable to perform ROS: Dementia   Tolerating Diet:npo Tolerating PT: pending  DRUG ALLERGIES:  No Known Allergies  VITALS:  Blood pressure (!) 121/102, pulse (!) 111, temperature 97.6 F (36.4 C), temperature source Oral, resp. rate 16, height 5\' 9"  (1.753 m), weight 53.9 kg (118 lb 14.4 oz), SpO2 94 %.  PHYSICAL EXAMINATION:   Physical Exam  GENERAL:  76 y.o.-year-old patient lying in the bed with no acute distress.  EYES: Pupils equal, round, reactive to light and accommodation. No scleral icterus. Extraocular muscles intact.  HEENT: Head atraumatic, normocephalic. Oropharynx and nasopharynx clear.  NECK:  Supple, no jugular venous distention. No thyroid enlargement, no tenderness.  LUNGS: decreased breath sounds bilaterally, no wheezing, rales, rhonchi. No use of accessory muscles of respiration.  CARDIOVASCULAR: S1, S2 normal. No murmurs, rubs, or gallops.  ABDOMEN: Soft, nontender, nondistended. Bowel sounds present. No organomegaly or mass.  EXTREMITIES: No cyanosis, clubbing or edema b/l.    NEUROLOGIC: grossly non focal PSYCHIATRIC:  patient is alert but confused due to dementia  SKIN: No obvious rash, lesion, or ulcer.   LABORATORY PANEL:  CBC  Recent Labs Lab 10/12/16 0415  WBC 8.3  HGB 15.6  HCT 45.5  PLT 234    Chemistries   Recent Labs Lab 10/12/16 0415  NA 143  K 3.2*  CL 103  CO2 28  GLUCOSE 147*  BUN 17  CREATININE 0.84  CALCIUM 9.1   Cardiac Enzymes  Recent Labs Lab 10/11/16 1519  TROPONINI <0.03   RADIOLOGY:  Dg Chest 2 View  Result Date: 10/12/2016 CLINICAL DATA:  Choking episode, cardiac arrest, possible aspiration. EXAM: CHEST  2 VIEW COMPARISON:   10/11/2016 and CT chest 12/31/2015. FINDINGS: Patient is rotated. Trachea is midline. Heart size within normal limits. Airspace opacification in the right lower lobe. Rounded airspace opacification in the left lower lobe. Biapical pleuroparenchymal scarring. Stomach is distended with air. Levoconvex scoliosis and degenerative changes in the spine. IMPRESSION: Bilateral lower lobe airspace opacification, right greater than left, possibly due to aspiration. Electronically Signed   By: Leanna Battles M.D.   On: 10/12/2016 08:00   Dg Chest Port 1 View  Result Date: 10/11/2016 CLINICAL DATA:  Choking episode with hypoxia EXAM: PORTABLE CHEST 1 VIEW COMPARISON:  01/02/2016 FINDINGS: Elevation of the left diaphragm as before. Mild opacity at the left base. No pleural effusion. Right lung clear. Stable cardiomediastinal silhouette with atherosclerosis. No pneumothorax. IMPRESSION: Minimal opacity at the left lung base could reflect atelectasis or small amount of aspiration. Electronically Signed   By: Jasmine Pang M.D.   On: 10/11/2016 14:46   ASSESSMENT AND PLAN:  Aaron [name]  is a 76 y.o. male with a known history of COPD, dementia, DM, Hyperlipidemia, [name], and was turning bluish with that and coughing, [name], [name]  Ac hypoxia respiratory  Failure due to bilateral pneumonia right> left and likely aspirated (has done this before) - Oxygen.  - prn Duoneb -  IV unasyn---change to oral augmentin -   Full code.per dter -  SLP eval appreciated  * dementia- baseline.  *  COPD   Cont duoneb  * Glaucoma   Cont eye drops.  CSW for d/c back to memory care unit tomorrow Spoke with pt's brother  Case discussed with Care Management/Social Worker. Management plans discussed with the patient, family and they are in agreement.  CODE STATUS: FULL  DVT Prophylaxis: heparin  TOTAL TIME TAKING CARE OF  THIS PATIENT: *30* minutes.  >50% time spent on counselling and coordination of care  POSSIBLE D/C IN *1-2* DAYS, DEPENDING ON CLINICAL CONDITION.  Note: This dictation was prepared with Dragon dictation along with smaller phrase technology. Any transcriptional errors that result from this process are unintentional.  Rawlins Stuard M.D on 10/13/2016 at 1:27 PM  Between 7am to 6pm - Pager - [phone]  After 6pm go to [email or web address] - password [email or web address]  [facility]  Office  [phone]  CC: Primary care physician; Patient, No Pcp Per

## 2016-10-14 LAB — GLUCOSE, CAPILLARY
Glucose-Capillary: 127 mg/dL — ABNORMAL HIGH (ref 65–99)
Glucose-Capillary: 142 mg/dL — ABNORMAL HIGH (ref 65–99)

## 2016-10-14 MED ORDER — AMOXICILLIN-POT CLAVULANATE 875-125 MG PO TABS: 1.0000 | ORAL_TABLET | Freq: Two times a day (BID) | ORAL | 0 refills | Status: AC

## 2016-10-14 NOTE — Discharge Summary (Signed)
[facility] - [place] at [facility]   PATIENT NAME: Aaron [name]    MR#:  [id number]  DATE OF BIRTH:  1941/03/04  DATE OF ADMISSION:  10/11/2016 ADMITTING PHYSICIAN: Altamese DillingVaibhavkumar Vachhani, MD  DATE OF DISCHARGE: 10/14/2016  PRIMARY CARE PHYSICIAN: Patient, No Pcp Per    ADMISSION DIAGNOSIS:  Choking episode [R09.89] Aspiration into airway [T17.908A] Aspiration pneumonia of left lower lobe due to vomit (HCC) [J69.0]  DISCHARGE DIAGNOSIS:  Aspiration Pneumonia  SECONDARY DIAGNOSIS:   Past Medical History:  Diagnosis Date  . COPD (chronic obstructive pulmonary disease) (HCC)   . Dementia   . Diabetes mellitus without complication (HCC)   . Hyperlipidemia   . Psoriasis     HOSPITAL COURSE:   [name], dementia, DM, Hyperlipidemia, [name], and was turning bluish with that and coughing, [name], [name]  *Acute  hypoxia respiratory  Failure due to bilateral pneumonia right> left and likely aspirated (has done this before) -Oxygen prn---weaned to RA - prnDuoneb -IV unasyn---changed to oral augmentin -Full code.per dter - SLP eval appreciated---dysphagia 1 nectar thick diet  * dementia- baseline.  * COPD Cont duoneb  * Glaucoma Cont eye drops.  CSW for d/c back to memory care unit today Spoke with pt's brother mike on the phone CONSULTS OBTAINED:    DRUG ALLERGIES:  No Known Allergies  DISCHARGE MEDICATIONS:   Current Discharge Medication List    START taking these medications   Details  amoxicillin-clavulanate (AUGMENTIN) 875-125 MG tablet Take 1 tablet by mouth every 12 (twelve) hours. Qty: 12 tablet, Refills: 0      CONTINUE these medications which have NOT CHANGED   Details  albuterol (PROAIR HFA) 108 (90 Base) MCG/ACT inhaler Inhale 2 puffs into the lungs every  4 (four) hours as needed for wheezing or shortness of breath.    aspirin EC 81 MG tablet Take 81 mg by mouth daily.    clobetasol cream (TEMOVATE) 0.05 % Apply 1 application topically every 12 (twelve) hours as needed (rash on back).    coal tar (DHS TAR) 0.5 % shampoo Apply 1 application topically See admin instructions. Apply to hair topically every Sunday and Wednesday evening shift    Fluticasone-Salmeterol (ADVAIR) 250-50 MCG/DOSE AEPB Inhale 1 puff into the lungs 2 (two) times daily.    latanoprost (XALATAN) 0.005 % ophthalmic solution Place 1 drop into both eyes at bedtime.    loratadine (CLARITIN) 10 MG tablet Take 10 mg by mouth daily as needed for allergies.    tiotropium (SPIRIVA) 18 MCG inhalation capsule Place 18 mcg into inhaler and inhale daily.    Vitamin D, Ergocalciferol, (DRISDOL) 50000 units CAPS capsule Take 50,000 Units by mouth every 30 (thirty) days. On the 30th of each month        If you experience worsening of your admission symptoms, develop shortness of breath, life threatening emergency, suicidal or homicidal thoughts you must seek medical attention immediately by calling 911 or calling your MD immediately  if symptoms less severe.  You Must read complete instructions/literature along with all the possible adverse reactions/side effects for all the Medicines you take and that have been prescribed to you. Take any new Medicines after you have completely understood and accept all the possible adverse reactions/side effects.   Please note  You were cared for by a hospitalist during your hospital stay.  If you have any questions about your discharge medications or the care you received while you were in the hospital after you are discharged, you can call the unit and asked to speak with the hospitalist on call if the hospitalist that took care of you is not available. Once you are discharged, your primary care physician will handle any further medical issues.  Please note that NO REFILLS for any discharge medications will be authorized once you are discharged, as it is imperative that you return to your primary care physician (or establish a relationship with a primary care physician if you do not have one) for your aftercare needs so that they can reassess your need for medications and monitor your lab values. Today   SUBJECTIVE  Confusion due to dementia No new issues per RN   VITAL SIGNS:  Blood pressure 137/70, pulse (!) 113, temperature 99.2 F (37.3 C), temperature source Axillary, resp. rate 20, height 5\' 9"  (1.753 m), weight 53.9 kg (118 lb 14.4 oz), SpO2 90 %.  I/O:   Intake/Output Summary (Last 24 hours) at 10/14/16 0727 Last data filed at 10/13/16 1800  Gross per 24 hour  Intake             2155 ml  Output                0 ml  Net             2155 ml    PHYSICAL EXAMINATION:  GENERAL:  76 y.o.-year-old patient lying in the bed with no acute distress.  EYES: Pupils equal, round, reactive to light and accommodation. No scleral icterus. Extraocular muscles intact.  HEENT: Head atraumatic, normocephalic. Oropharynx and nasopharynx clear.  NECK:  Supple, no jugular venous distention. No thyroid enlargement, no tenderness.  LUNGS: Normal breath sounds bilaterally, no wheezing, rales,rhonchi or crepitation. No use of accessory muscles of respiration.  CARDIOVASCULAR: S1, S2 normal. No murmurs, rubs, or gallops.  ABDOMEN: Soft, non-tender, non-distended. Bowel sounds present. No organomegaly or mass.  EXTREMITIES: No pedal edema, cyanosis, or clubbing.  NEUROLOGIC: grossly non focal PSYCHIATRIC: The patient is alert and confused SKIN: No obvious rash, lesion, or ulcer.   DATA REVIEW:   CBC   Recent Labs Lab 10/12/16 0415  WBC 8.3  HGB 15.6  HCT 45.5  PLT 234    Chemistries   Recent Labs Lab 10/12/16 0415  NA 143  K 3.2*  CL 103  CO2 28  GLUCOSE 147*  BUN 17  CREATININE 0.84  CALCIUM 9.1    Microbiology  Results   Recent Results (from the past 240 hour(s))  MRSA PCR Screening     Status: None   Collection Time: 10/11/16  8:36 PM  Result Value Ref Range Status   MRSA by PCR NEGATIVE NEGATIVE Final    Comment:        The GeneXpert MRSA Assay (FDA approved for NASAL specimens only), is one component of a comprehensive MRSA colonization surveillance program. It is not intended to diagnose MRSA infection nor to guide or monitor treatment for MRSA infections.     RADIOLOGY:  Dg Chest 2 View  Result Date: 10/12/2016 CLINICAL DATA:  Choking episode, cardiac arrest, possible aspiration. EXAM: CHEST  2 VIEW COMPARISON:  10/11/2016 and CT chest 12/31/2015. FINDINGS: Patient is rotated. Trachea is midline. Heart size within normal limits. Airspace opacification in the right lower lobe. Rounded airspace opacification in the left lower lobe. Biapical pleuroparenchymal scarring. Stomach is distended with air.  Levoconvex scoliosis and degenerative changes in the spine. IMPRESSION: Bilateral lower lobe airspace opacification, right greater than left, possibly due to aspiration. Electronically Signed   By: Leanna Battles M.D.   On: 10/12/2016 08:00     Management plans discussed with the patient, family and they are in agreement.  CODE STATUS:     Code Status Orders        Start     Ordered   10/11/16 1924  Full code  Continuous     10/11/16 1923    Code Status History    Date Active Date Inactive Code Status Order ID Comments User Context   12/31/2015  8:41 PM 01/03/2016  9:08 PM Full Code [id number]  Kathlene Cote, PA-C Inpatient    Advance Directive Documentation     Most Recent Value  Type of Advance Directive  Healthcare Power of Attorney, Living will  Pre-existing out of facility DNR order (yellow form or pink MOST form)  Pink MOST form placed in chart (order not valid for inpatient use)  "MOST" Form in Place?  -      TOTAL TIME TAKING CARE OF THIS PATIENT: *40* minutes.     Chidi Shirer M.D on 10/14/2016 at 7:27 AM  Between 7am to 6pm - Pager - [phone] After 6pm go to [email or web address] - password [email or web address]  [facility]  Office  [phone]  CC: Primary care physician; Patient, No Pcp Per

## 2016-10-14 NOTE — Plan of Care (Signed)
Problem: Nutrition: Goal: Adequate nutrition will be maintained Outcome: Progressing Pt with good appetite. Feeding assistance required.   Problem: Respiratory: Goal: Respiratory status will improve Outcome: Progressing Pt weaned to room air. O2 sats in 90's

## 2016-10-14 NOTE — Progress Notes (Signed)
[facility] is stable and [facility]. Daughter, April, at bedside aware of plan. [facility] providing transport back to facility;  [facility]

## 2016-10-14 NOTE — Clinical Social Work Note (Signed)
Pt is ready for discharge today and [facility]. [facility] is a [facility] pt and [facility] will provide transportation at 1 PM. Pt's daughter is aware and agreeable to discharge plan. Facility has received discharge information and is ready to accept pt. [facility] is aware. RN is aware. CSW is signing off as no further needs identified.  Dede QuerySarah Connie Lasater, MSW, LCSW  Clinical Social Worker  [phone]

## 2016-10-14 NOTE — Care Management Important Message (Signed)
Important Message  Patient Details  Name: Aaron GrillsJohnny P Sides MRN: [id number] Date of Birth: 05/01/1941   Medicare Important Message Given:  Yes    Gwenette GreetBrenda S Rhys Anchondo, RN 10/14/2016, 8:20 AM

## 2016-10-14 NOTE — Discharge Instructions (Signed)
DYSPHAGIA 1 Nectar thick diet

## 2017-07-25 DEATH — deceased
